# Patient Record
Sex: Female | Born: 1985 | Race: Black or African American | Hispanic: No | Marital: Single | Smoking: Current every day smoker
Health system: Southern US, Community
[De-identification: ages and names within clinical notes are randomized; demographics above are authoritative.]

## PROBLEM LIST (undated history)

## (undated) DIAGNOSIS — D259 Leiomyoma of uterus, unspecified: Secondary | ICD-10-CM

## (undated) DIAGNOSIS — D069 Carcinoma in situ of cervix, unspecified: Secondary | ICD-10-CM

## (undated) HISTORY — DX: Carcinoma in situ of cervix, unspecified: D06.9

## (undated) HISTORY — DX: Leiomyoma of uterus, unspecified: D25.9

---

## 2012-11-09 ENCOUNTER — Encounter (HOSPITAL_COMMUNITY): Payer: Self-pay | Admitting: Nurse Practitioner

## 2012-11-09 ENCOUNTER — Emergency Department (HOSPITAL_COMMUNITY): Payer: Self-pay

## 2012-11-09 ENCOUNTER — Emergency Department (HOSPITAL_COMMUNITY)
Admission: EM | Admit: 2012-11-09 | Discharge: 2012-11-09 | Disposition: A | Discharge status: Home / Self Care | Payer: Self-pay | Attending: Emergency Medicine | Admitting: Emergency Medicine

## 2012-11-09 DIAGNOSIS — R110 Nausea: Secondary | ICD-10-CM | POA: Insufficient documentation

## 2012-11-09 DIAGNOSIS — R51 Headache: Secondary | ICD-10-CM | POA: Insufficient documentation

## 2012-11-09 DIAGNOSIS — R05 Cough: Secondary | ICD-10-CM | POA: Insufficient documentation

## 2012-11-09 DIAGNOSIS — J069 Acute upper respiratory infection, unspecified: Secondary | ICD-10-CM | POA: Insufficient documentation

## 2012-11-09 DIAGNOSIS — J3489 Other specified disorders of nose and nasal sinuses: Secondary | ICD-10-CM | POA: Insufficient documentation

## 2012-11-09 DIAGNOSIS — J40 Bronchitis, not specified as acute or chronic: Secondary | ICD-10-CM | POA: Insufficient documentation

## 2012-11-09 DIAGNOSIS — F17200 Nicotine dependence, unspecified, uncomplicated: Secondary | ICD-10-CM | POA: Insufficient documentation

## 2012-11-09 DIAGNOSIS — J111 Influenza due to unidentified influenza virus with other respiratory manifestations: Secondary | ICD-10-CM | POA: Insufficient documentation

## 2012-11-09 DIAGNOSIS — R059 Cough, unspecified: Secondary | ICD-10-CM | POA: Insufficient documentation

## 2012-11-09 MED ORDER — HYDROCODONE-HOMATROPINE 5-1.5 MG/5ML PO SYRP: 5.0000 mL | ORAL_SOLUTION | Freq: Four times a day (QID) | ORAL | Status: DC | PRN

## 2012-11-09 MED ORDER — OXYMETAZOLINE HCL 0.05 % NA SOLN: 2.0000 | Freq: Two times a day (BID) | NASAL | Status: DC

## 2012-11-09 MED ORDER — ACETAMINOPHEN 325 MG PO TABS
650.0000 mg | ORAL_TABLET | Freq: Once | ORAL | Status: AC
  Administered 2012-11-09: 650 mg via ORAL
  Filled 2012-11-09: qty 2

## 2012-11-09 NOTE — ED Notes (Signed)
C/o fevers, chills, dry cough since Monday. Seen at [facility] hospital Monday and given motrin and hydrocodone cough syrup with no relief. symptoms have persisted and continues to have fevers.

## 2012-11-09 NOTE — ED Notes (Signed)
Patient transported to X-ray 

## 2012-11-09 NOTE — ED Notes (Addendum)
Pt c/o congestion since Monday, reports she went to ED in [place] and they told her she had an URI. Pt reports she thinks she started to get a fever today. And reports she has been taking tylenol at home, but didn't receive any prescriptions from [facility]. Pt in nad. Family at bedside.

## 2012-11-09 NOTE — ED Provider Notes (Signed)
History     CSN: [id number]  Arrival date & time 11/09/12  1321   First MD Initiated Contact with Patient 11/09/12 1337      Chief Complaint  Patient presents with  . URI    (Consider location/radiation/quality/duration/timing/severity/associated sxs/prior treatment) HPI Comments: Patient presents today with a chief complaint of fever, chills, headache, cough, and body aches.  She reports that her symptoms have been present for the past 5 days.  Symptoms unchanged from onset.  She was seen in the ED at [facility] 5 days ago and was diagnosed with an URI.  She had a flu swab done at that time, which she reports was negative.  She states that she also had UA, which was also negative.  She denies any urinary symptoms.  She has been taking a cough suppressant containing Hydrocodone and Motrin for her symptoms, which is helping somewhat.  She did not have the Influenza vaccine this year and has been around sick contacts with the Flu.  She denies chest pain or SOB.  Denies neck pain or stiffness.   The history is provided by the patient.    History reviewed. No pertinent past medical history.  History reviewed. No pertinent past surgical history.  History reviewed. No pertinent family history.  History  Substance Use Topics  . Smoking status: Current Every Day Smoker  . Smokeless tobacco: Not on file  . Alcohol Use: No    OB History    Grav Para Term Preterm Abortions TAB SAB Ect Mult Living                  Review of Systems  Constitutional: Positive for fever and chills.  HENT: Positive for congestion and rhinorrhea. Negative for sore throat, trouble swallowing, neck pain, neck stiffness and sinus pressure.   Respiratory: Positive for cough. Negative for shortness of breath.   Cardiovascular: Negative for chest pain.  Gastrointestinal: Positive for nausea. Negative for vomiting, abdominal pain and diarrhea.  Neurological: Positive for headaches.    Allergies  Sulfa  antibiotics  Home Medications   Current Outpatient Rx  Name  Route  Sig  Dispense  Refill  . HYDROCODONE-HOMATROPINE 5-1.5 MG/5ML PO SYRP   Oral   Take 15 mLs by mouth every 6 (six) hours as needed. For cough           BP 110/74  Pulse 108  Temp 100.4 F (38 C) (Oral)  Resp 18  SpO2 98%  LMP 10/31/2012  Physical Exam  Nursing note and vitals reviewed. Constitutional: She appears well-developed and well-nourished. No distress.  HENT:  Head: Normocephalic and atraumatic.  Right Ear: Tympanic membrane and ear canal normal.  Left Ear: Tympanic membrane and ear canal normal.  Nose: Mucosal edema and rhinorrhea present. Right sinus exhibits no maxillary sinus tenderness and no frontal sinus tenderness. Left sinus exhibits no maxillary sinus tenderness and no frontal sinus tenderness.  Mouth/Throat: Uvula is midline, oropharynx is clear and moist and mucous membranes are normal. No oropharyngeal exudate or posterior oropharyngeal edema.  Neck: Normal range of motion. Neck supple.  Cardiovascular: Normal rate, regular rhythm and normal heart sounds.   Pulmonary/Chest: Effort normal and breath sounds normal. No respiratory distress. She has no decreased breath sounds. She has no wheezes. She has no rales.  Neurological: She is alert.  Skin: Skin is warm and dry. She is not diaphoretic.  Psychiatric: She has a normal mood and affect.    ED Course  Procedures (including critical  care time)  Labs Reviewed - No data to display Dg Chest 2 View  11/09/2012  *RADIOLOGY REPORT*  Clinical Data: Fever.  Cough.  Congestion.  CHEST - 2 VIEW  Comparison: None.  Findings: Airway thickening may reflect bronchitis or reactive airways disease.  No airspace opacity is identified to suggest bacterial pneumonia pattern.  Cardiac and mediastinal contours appear unremarkable.  No pleural effusion identified.  IMPRESSION: 1. Airway thickening may reflect bronchitis or reactive airways disease.  No  airspace opacity is identified to suggest bacterial pneumonia pattern.   Original Report Authenticated By: Gaylyn Rong, M.D.      1. Influenza   2. Bronchitis       MDM  Patient with symptoms consistent with influenza.  Vitals are stable, low-grade fever.  No signs of dehydration, tolerating PO's.  Lungs are clear.Discussed the cost versus benefit of Tamiflu treatment with the patient.  The patient understands that symptoms are greater than the recommended 24-48 hour window of treatment.  Patient will be discharged with instructions to orally hydrate, rest, and use over-the-counter medications such as anti-inflammatories ibuprofen and Aleve for muscle aches and Tylenol for fever.  Patient will also be given a cough suppressant.         Pascal Lux [place], PA-C 11/09/12 1757

## 2012-11-09 NOTE — ED Notes (Signed)
Fever, chills, body aches, cough since Monday. Sx continue.

## 2012-11-10 NOTE — ED Provider Notes (Signed)
Medical screening examination/treatment/procedure(s) were performed by non-physician practitioner and as supervising physician I was immediately available for consultation/collaboration.  Daden Mahany, MD 11/10/12 0851 

## 2015-02-17 ENCOUNTER — Other Ambulatory Visit (HOSPITAL_COMMUNITY): Payer: Self-pay | Admitting: Obstetrics and Gynecology

## 2015-02-17 DIAGNOSIS — E049 Nontoxic goiter, unspecified: Secondary | ICD-10-CM

## 2015-02-19 ENCOUNTER — Ambulatory Visit (HOSPITAL_COMMUNITY)
Admission: RE | Admit: 2015-02-19 | Discharge: 2015-02-19 | Disposition: A | Discharge status: Home / Self Care | Payer: BLUE CROSS/BLUE SHIELD | Source: Ambulatory Visit | Attending: Obstetrics and Gynecology | Admitting: Obstetrics and Gynecology

## 2015-02-19 DIAGNOSIS — E042 Nontoxic multinodular goiter: Secondary | ICD-10-CM | POA: Diagnosis not present

## 2015-02-19 DIAGNOSIS — E049 Nontoxic goiter, unspecified: Secondary | ICD-10-CM | POA: Diagnosis present

## 2015-03-04 HISTORY — PX: CERVICAL BIOPSY  W/ LOOP ELECTRODE EXCISION: SUR135

## 2015-11-18 ENCOUNTER — Encounter: Payer: Self-pay | Admitting: Gynecology

## 2015-11-18 ENCOUNTER — Ambulatory Visit (INDEPENDENT_AMBULATORY_CARE_PROVIDER_SITE_OTHER): Payer: BLUE CROSS/BLUE SHIELD | Admitting: Gynecology

## 2015-11-18 VITALS — BP 128/80 | Ht 62.0 in | Wt 170.0 lb

## 2015-11-18 DIAGNOSIS — Z30432 Encounter for removal of intrauterine contraceptive device: Secondary | ICD-10-CM | POA: Diagnosis not present

## 2015-11-18 DIAGNOSIS — Z309 Encounter for contraceptive management, unspecified: Secondary | ICD-10-CM

## 2015-11-18 DIAGNOSIS — Z30011 Encounter for initial prescription of contraceptive pills: Secondary | ICD-10-CM

## 2015-11-18 DIAGNOSIS — Z8742 Personal history of other diseases of the female genital tract: Secondary | ICD-10-CM | POA: Diagnosis not present

## 2015-11-18 DIAGNOSIS — Z720 Tobacco use: Secondary | ICD-10-CM | POA: Diagnosis not present

## 2015-11-18 DIAGNOSIS — F17200 Nicotine dependence, unspecified, uncomplicated: Secondary | ICD-10-CM

## 2015-11-18 MED ORDER — LEVONORGESTREL-ETHINYL ESTRAD 0.1-20 MG-MCG PO TABS: 1.0000 | ORAL_TABLET | Freq: Every day | ORAL | Status: DC

## 2015-11-18 NOTE — Patient Instructions (Signed)
Steps to Quit Smoking  Smoking tobacco can be harmful to your health and can affect almost every organ in your body. Smoking puts you, and those around you, at risk for developing many serious chronic diseases. Quitting smoking is difficult, but it is one of the best things that you can do for your health. It is never too late to quit. WHAT ARE THE BENEFITS OF QUITTING SMOKING? When you quit smoking, you lower your risk of developing serious diseases and conditions, such as:  Lung cancer or lung disease, such as COPD.  Heart disease.  Stroke.  Heart attack.  Infertility.  Osteoporosis and bone fractures. Additionally, symptoms such as coughing, wheezing, and shortness of breath may get better when you quit. You may also find that you get sick less often because your body is stronger at fighting off colds and infections. If you are pregnant, quitting smoking can help to reduce your chances of having a baby of low birth weight. HOW DO I GET READY TO QUIT? When you decide to quit smoking, create a plan to make sure that you are successful. Before you quit:  Pick a date to quit. Set a date within the next two weeks to give you time to prepare.  Write down the reasons why you are quitting. Keep this list in places where you will see it often, such as on your bathroom mirror or in your car or wallet.  Identify the people, places, things, and activities that make you want to smoke (triggers) and avoid them. Make sure to take these actions:  Throw away all cigarettes at home, at work, and in your car.  Throw away smoking accessories, such as ashtrays and lighters.  Clean your car and make sure to empty the ashtray.  Clean your home, including curtains and carpets.  Tell your family, friends, and coworkers that you are quitting. Support from your loved ones can make quitting easier.  Talk with your health care provider about your options for quitting smoking.  Find out what treatment  options are covered by your health insurance. WHAT STRATEGIES CAN I USE TO QUIT SMOKING?  Talk with your healthcare provider about different strategies to quit smoking. Some strategies include:  Quitting smoking altogether instead of gradually lessening how much you smoke over a period of time. Research shows that quitting "cold turkey" is more successful than gradually quitting.  Attending in-person counseling to help you build problem-solving skills. You are more likely to have success in quitting if you attend several counseling sessions. Even short sessions of 10 minutes can be effective.  Finding resources and support systems that can help you to quit smoking and remain smoke-free after you quit. These resources are most helpful when you use them often. They can include:  Online chats with a counselor.  Telephone quitlines.  Printed self-help materials.  Support groups or group counseling.  Text messaging programs.  Mobile phone applications.  Taking medicines to help you quit smoking. (If you are pregnant or breastfeeding, talk with your health care provider first.) Some medicines contain nicotine and some do not. Both types of medicines help with cravings, but the medicines that include nicotine help to relieve withdrawal symptoms. Your health care provider may recommend:  Nicotine patches, gum, or lozenges.  Nicotine inhalers or sprays.  Non-nicotine medicine that is taken by mouth. Talk with your health care provider about combining strategies, such as taking medicines while you are also receiving in-person counseling. Using these two strategies together makes   you more likely to succeed in quitting than if you used either strategy on its own. If you are pregnant or breastfeeding, talk with your health care provider about finding counseling or other support strategies to quit smoking. Do not take medicine to help you quit smoking unless told to do so by your health care  provider. WHAT THINGS CAN I DO TO MAKE IT EASIER TO QUIT? Quitting smoking might feel overwhelming at first, but there is a lot that you can do to make it easier. Take these important actions:  Reach out to your family and friends and ask that they support and encourage you during this time. Call telephone quitlines, reach out to support groups, or work with a counselor for support.  Ask people who smoke to avoid smoking around you.  Avoid places that trigger you to smoke, such as bars, parties, or smoke-break areas at work.  Spend time around people who do not smoke.  Lessen stress in your life, because stress can be a smoking trigger for some people. To lessen stress, try:  Exercising regularly.  Deep-breathing exercises.  Yoga.  Meditating.  Performing a body scan. This involves closing your eyes, scanning your body from head to toe, and noticing which parts of your body are particularly tense. Purposefully relax the muscles in those areas.  Download or purchase mobile phone or tablet apps (applications) that can help you stick to your quit plan by providing reminders, tips, and encouragement. There are many free apps, such as QuitGuide from the [employer] Office manager for Disease Control and Prevention). You can find other support for quitting smoking (smoking cessation) through smokefree.gov and other websites. HOW WILL I FEEL WHEN I QUIT SMOKING? Within the first 24 hours of quitting smoking, you may start to feel some withdrawal symptoms. These symptoms are usually most noticeable 2-3 days after quitting, but they usually do not last beyond 2-3 weeks. Changes or symptoms that you might experience include:  Mood swings.  Restlessness, anxiety, or irritation.  Difficulty concentrating.  Dizziness.  Strong cravings for sugary foods in addition to nicotine.  Mild weight gain.  Constipation.  Nausea.  Coughing or a sore throat.  Changes in how your medicines work in your  body.  A depressed mood.  Difficulty sleeping (insomnia). After the first 2-3 weeks of quitting, you may start to notice more positive results, such as:  Improved sense of smell and taste.  Decreased coughing and sore throat.  Slower heart rate.  Lower blood pressure.  Clearer skin.  The ability to breathe more easily.  Fewer sick days. Quitting smoking is very challenging for most people. Do not get discouraged if you are not successful the first time. Some people need to make many attempts to quit before they achieve long-term success. Do your best to stick to your quit plan, and talk with your health care provider if you have any questions or concerns.   This information is not intended to replace advice given to you by your health care provider. Make sure you discuss any questions you have with your health care provider.   Document Released: 09/26/2001 Document Revised: 02/16/2015 Document Reviewed: 02/16/2015 Elsevier Interactive Patient Education 2016 [employer]. Oral Contraception Information Oral contraceptive pills (OCPs) are medicines taken to prevent pregnancy. OCPs work by preventing the ovaries from releasing eggs. The hormones in OCPs also cause the cervical mucus to thicken, preventing the sperm from entering the uterus. The hormones also cause the uterine lining to become thin, not  allowing a fertilized egg to attach to the inside of the uterus. OCPs are highly effective when taken exactly as prescribed. However, OCPs do not prevent sexually transmitted diseases (STDs). Safe sex practices, such as using condoms along with the pill, can help prevent STDs.  Before taking the pill, you may have a physical exam and Pap test. Your health care provider may order blood tests. The health care provider will make sure you are a good candidate for oral contraception. Discuss with your health care provider the possible side effects of the OCP you may be prescribed. When starting an  OCP, it can take 2 to 3 months for the body to adjust to the changes in hormone levels in your body.  TYPES OF ORAL CONTRACEPTION  The combination pill--This pill contains estrogen and progestin (synthetic progesterone) hormones. The combination pill comes in 21-day, 28-day, or 91-day packs. Some types of combination pills are meant to be taken continuously (365-day pills). With 21-day packs, you do not take pills for 7 days after the last pill. With 28-day packs, the pill is taken every day. The last 7 pills are without hormones. Certain types of pills have more than 21 hormone-containing pills. With 91-day packs, the first 84 pills contain both hormones, and the last 7 pills contain no hormones or contain estrogen only.  The minipill--This pill contains the progesterone hormone only. The pill is taken every day continuously. It is very important to take the pill at the same time each day. The minipill comes in packs of 28 pills. All 28 pills contain the hormone.  ADVANTAGES OF ORAL CONTRACEPTIVE PILLS  Decreases premenstrual symptoms.   Treats menstrual period cramps.   Regulates the menstrual cycle.   Decreases a heavy menstrual flow.   May treatacne, depending on the type of pill.   Treats abnormal uterine bleeding.   Treats polycystic ovarian syndrome.   Treats endometriosis.   Can be used as emergency contraception.  THINGS THAT CAN MAKE ORAL CONTRACEPTIVE PILLS LESS EFFECTIVE OCPs can be less effective if:   You forget to take the pill at the same time every day.   You have a stomach or intestinal disease that lessens the absorption of the pill.   You take OCPs with other medicines that make OCPs less effective, such as antibiotics, certain HIV medicines, and some seizure medicines.   You take expired OCPs.   You forget to restart the pill on day 7, when using the packs of 21 pills.  RISKS ASSOCIATED WITH ORAL CONTRACEPTIVE PILLS  Oral contraceptive pills  can sometimes cause side effects, such as:  Headache.  Nausea.  Breast tenderness.  Irregular bleeding or spotting. Combination pills are also associated with a small increased risk of:  Blood clots.  Heart attack.  Stroke.   This information is not intended to replace advice given to you by your health care provider. Make sure you discuss any questions you have with your health care provider.   Document Released: 12/23/2002 Document Revised: 07/23/2013 Document Reviewed: 03/23/2013 Elsevier Interactive Patient Education [employer].

## 2015-11-18 NOTE — Progress Notes (Signed)
   Patient is a 30 year old gravida 0 new patient to the practice. Patient was here requesting to have the Mirena IUD removed that was placed in August 2016 by another provider in the community. Patient states that she does not like the uncertainty of her menses and sometimes spotting. Patient also had informed me that in the past she's had history of right ovarian cyst. Patient smokes approximately half a pack cigarette per day. Patient stated that last year she had a Pap smear which was normal. She is due for her next annual exam this summer. Patient was wishing to go on the oral contraceptive pill. She denied any family history of any clotting disorders or herself.  Exam: Abdomen: Soft nontender no rebound or guarding Pelvic: Bartholin urethra Skene was within normal limits Vagina: No lesions or discharge Cervix: IUD string visualized Bimanual exam: Uterus upper limits of normal nontender no palpable masses Adnexa: No palpable masses or tenderness Rectal exam: Not done  The cervix was cleansed with Betadine solution. A [place] clamp was utilized to grasp the IUD string. The IUD was retrieved shown to the patient discarded.  Assessment/plan: Patient requesting removal of the Mirena IUD which is in place last year was accomplished without any difficulty. Patient requesting going on the oral contraceptive pill. She is going to be placed on a low dose 20 g oral contraceptive pill. I stressed upon her the importance of discontinuing smoking as well as literature information provided. She is fully aware that she would be at increased risk for the venous thrombosis and pulmonary embolism. She fully understands and accepts and is working on contact placed discontinuation of her smoking habit. Patient will return back in June of this year for annual exam.

## 2015-12-06 ENCOUNTER — Encounter: Payer: Self-pay | Admitting: Gynecology

## 2015-12-07 IMAGING — US US SOFT TISSUE HEAD/NECK
1 series · 14 of 25 positions shown · non-contrast
Comparison: None.

CLINICAL DATA: Enlarged thyroid.

EXAM:
THYROID ULTRASOUND
TECHNIQUE: Ultrasound examination of the thyroid gland and adjacent soft
tissues was performed.

[Series 1: us soft tissue head/neck · 0.05mm/px · 14 of 57 slices shown]
[im 1/57]
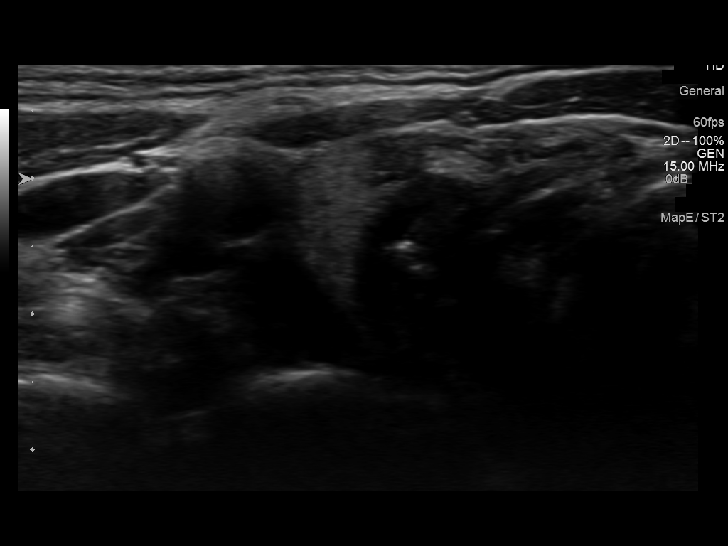
[im 5/57]
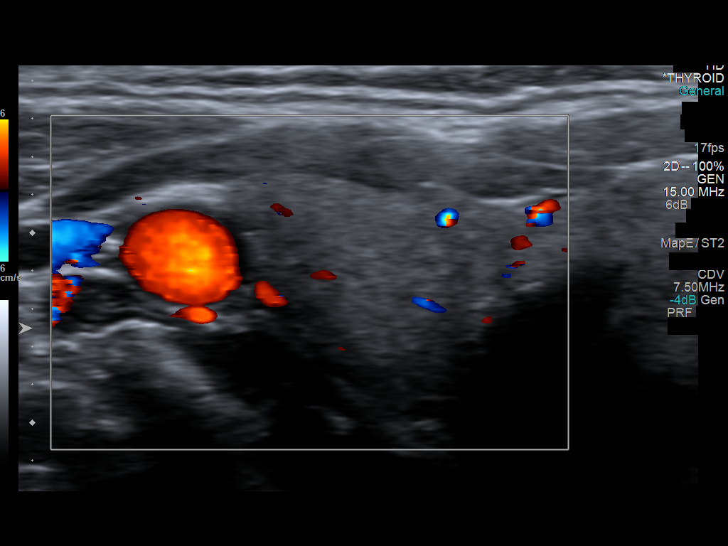
[im 10/57]
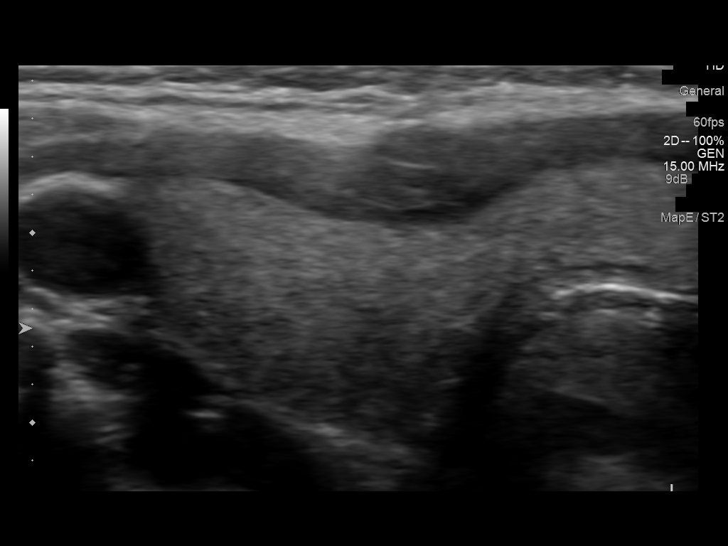
[im 15/57]
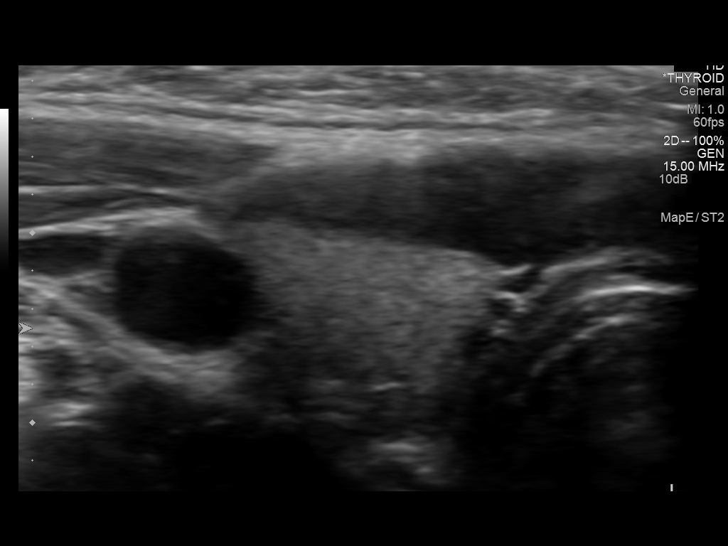
[im 19/57]
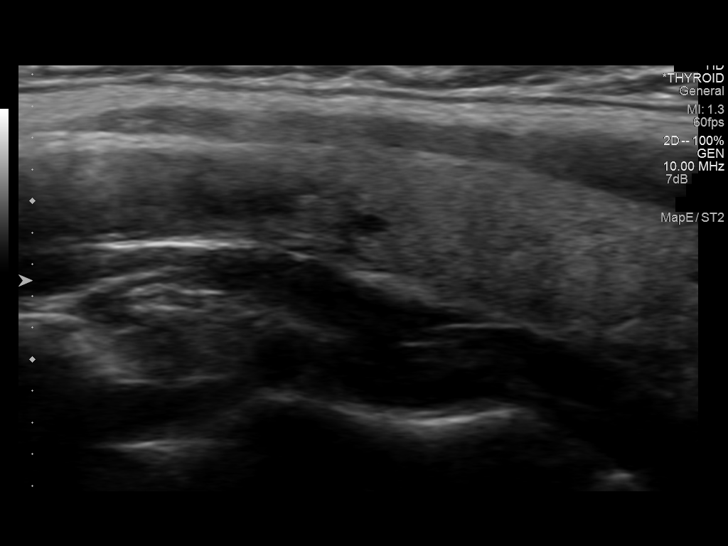
[im 22/57]
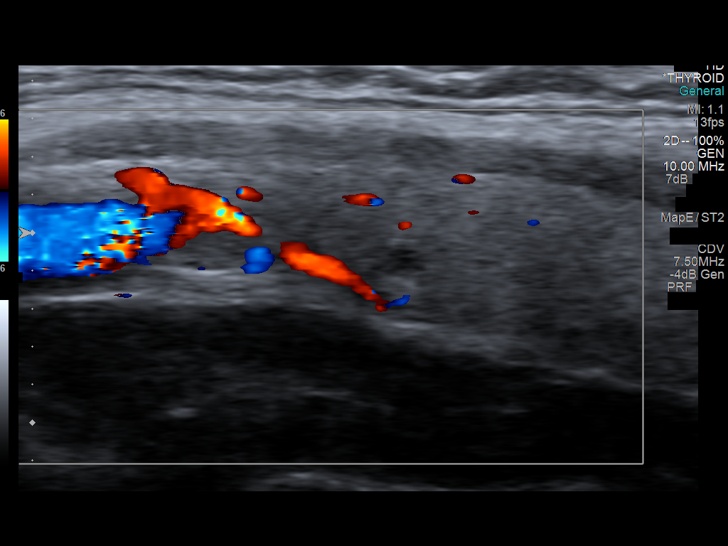
[im 26/57]
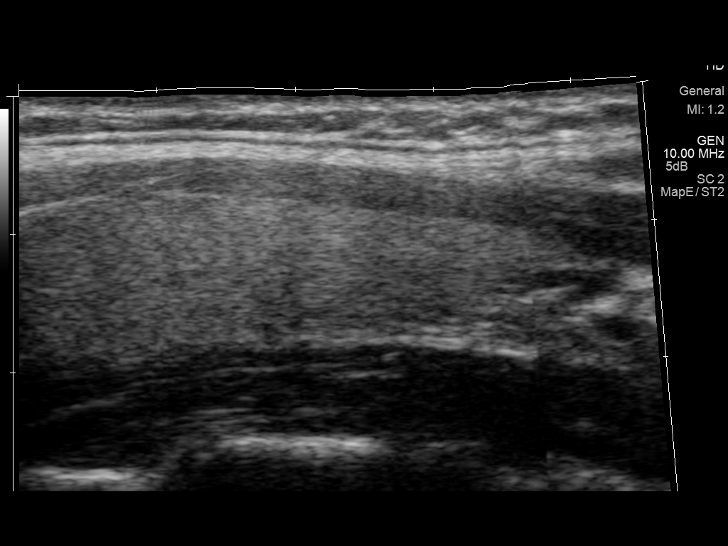
[im 31/57]
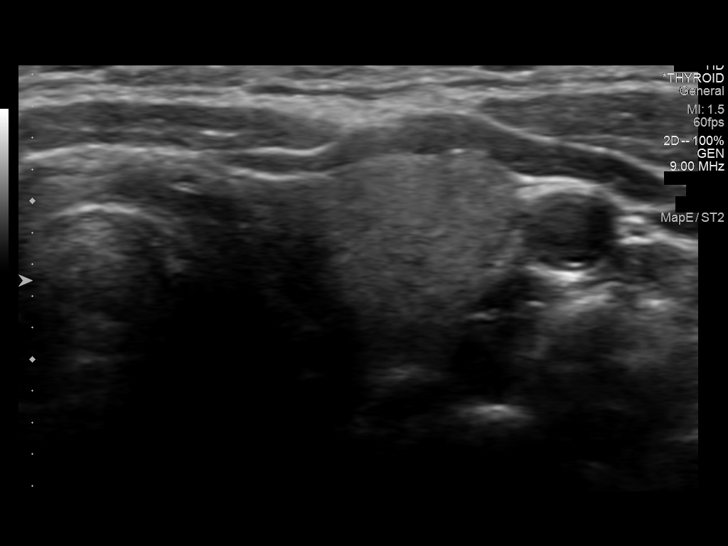
[im 36/57]
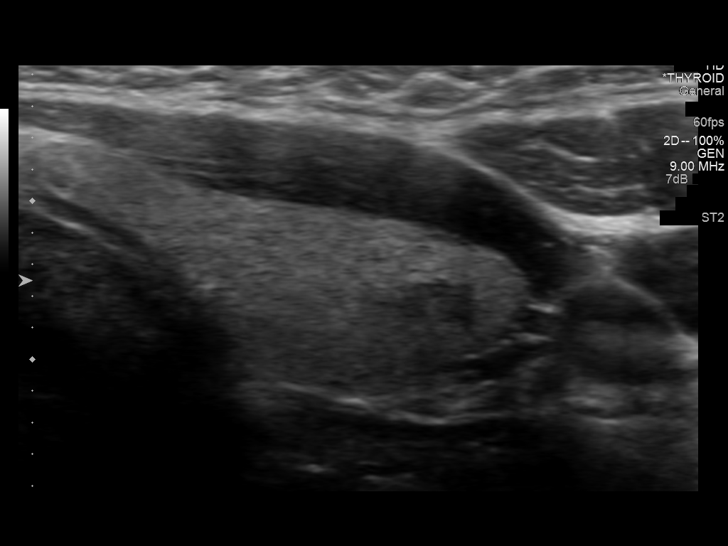
[im 38/57]
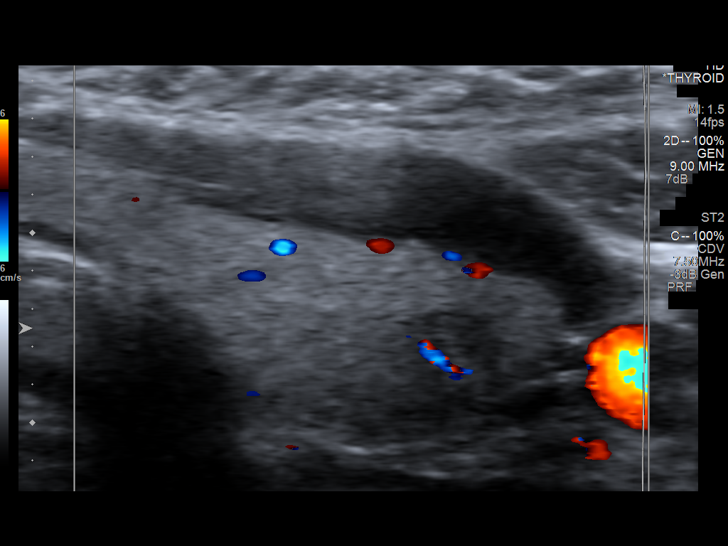
[im 43/57]
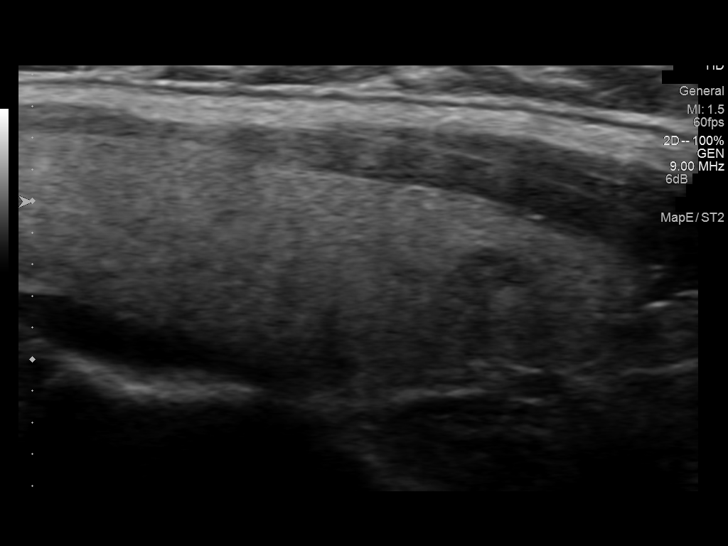
[im 47/57]
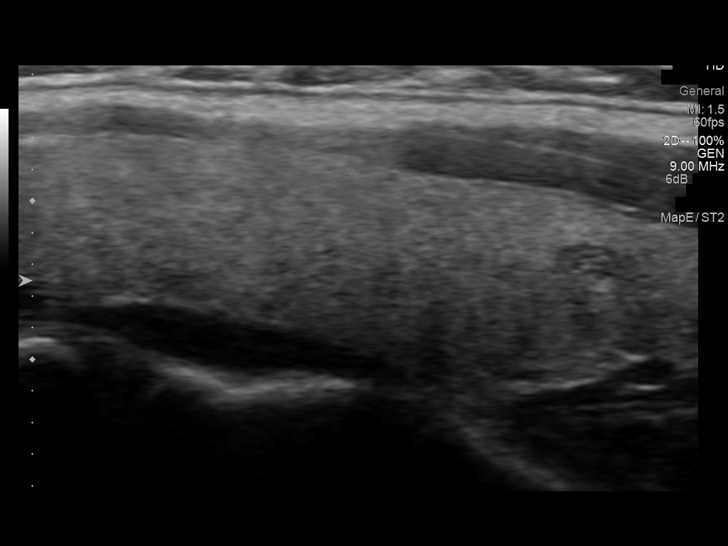
[im 52/57]
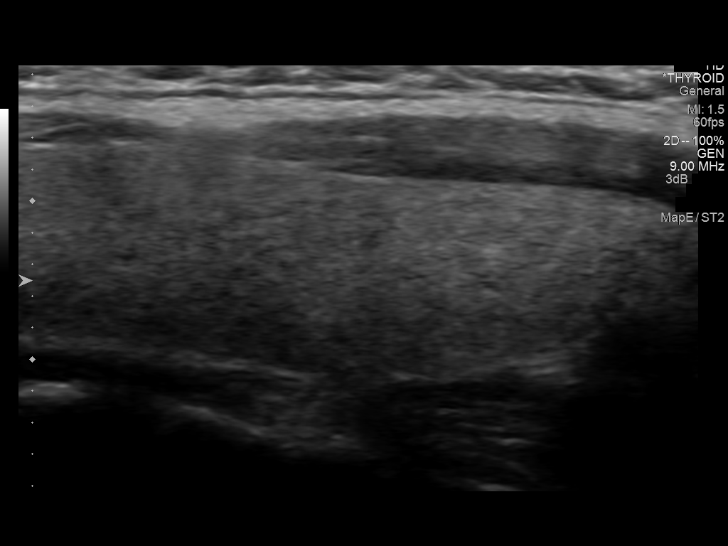
[im 57/57]
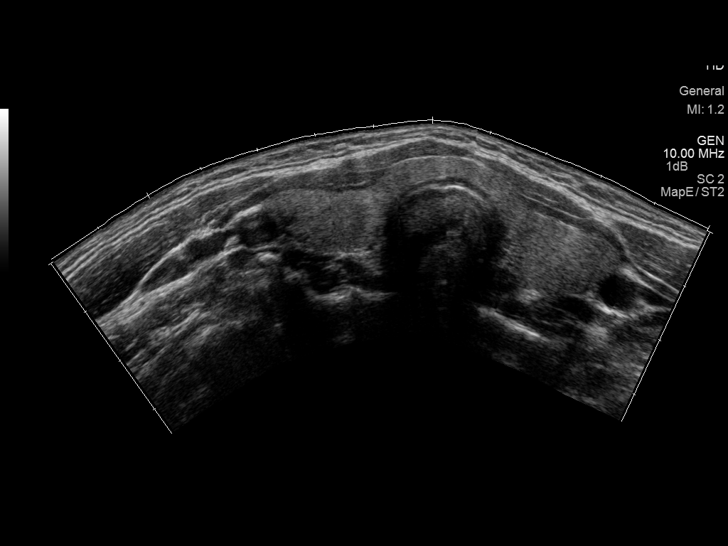

[14 of 25 positions shown; findings below may reference images not displayed]

FINDINGS: Right thyroid lobe

Measurements: 4.4 x 1.2 x 1.8 cm. There is a punctate hypoechoic
structure in the right thyroid lobe measuring up to 0.2 cm.
Otherwise, the thyroid tissue is homogeneous.

Left thyroid lobe

Measurements: 4.8 x 1.3 x 1.7 cm. Poorly defined hypoechoic nodule
in the left thyroid lobe measures up to 0.5 cm.

Isthmus

Thickness: 0.4 cm.  No nodules visualized.

Lymphadenopathy

None visualized.
IMPRESSION: Small thyroid nodules. Largest measures 0.5 cm. These nodules do not
meet criteria for biopsy. This recommendation follows the consensus
statement: Management of Thyroid Nodules Detected at US: Society of
Radiologists in Ultrasound Consensus Conference Statement. Radiology

## 2016-03-28 ENCOUNTER — Ambulatory Visit (INDEPENDENT_AMBULATORY_CARE_PROVIDER_SITE_OTHER): Payer: BLUE CROSS/BLUE SHIELD | Admitting: Gynecology

## 2016-03-28 ENCOUNTER — Encounter: Payer: Self-pay | Admitting: Gynecology

## 2016-03-28 VITALS — BP 128/84 | Ht 62.0 in | Wt 175.8 lb

## 2016-03-28 DIAGNOSIS — Z8619 Personal history of other infectious and parasitic diseases: Secondary | ICD-10-CM

## 2016-03-28 DIAGNOSIS — D259 Leiomyoma of uterus, unspecified: Secondary | ICD-10-CM

## 2016-03-28 DIAGNOSIS — Z87410 Personal history of cervical dysplasia: Secondary | ICD-10-CM | POA: Insufficient documentation

## 2016-03-28 DIAGNOSIS — Z01419 Encounter for gynecological examination (general) (routine) without abnormal findings: Secondary | ICD-10-CM

## 2016-03-28 NOTE — Progress Notes (Signed)
Jane Cannon 26-May-1986 [id number]   History:    30 y.o.  for annual gyn exam who was seen in the office for the first time in February this year where she was requesting removal of her Mirena IUD. She is contemplating on getting pregnant. She does smoke approximately half a pack cigarette per day. Review of her record also indicated that with another provider several years ago she had a LEEP cervical conization for CIN-3 reports normal Pap smears afterwards. She also has a history of a fibroid uterus. Her PCP has been doing her blood work at [facility]. She is currently on her menstrual cycle which started today. Patient states that many years ago she had Chlamydia infection.  Past medical history,surgical history, family history and social history were all reviewed and documented in the EPIC chart.  Gynecologic History Patient's last menstrual period was 03/28/2016. Contraception: none Last Pap: Several years ago. Results were: See above Last mammogram: Not indicated. Results were: Not indicated  Obstetric History OB History  Gravida Para Term Preterm AB SAB TAB Ectopic Multiple Living  0 0 0 0 0 0 0 0 0 0         ROS: A ROS was performed and pertinent positives and negatives are included in the history.  GENERAL: No fevers or chills. HEENT: No change in vision, no earache, sore throat or sinus congestion. NECK: No pain or stiffness. CARDIOVASCULAR: No chest pain or pressure. No palpitations. PULMONARY: No shortness of breath, cough or wheeze. GASTROINTESTINAL: No abdominal pain, nausea, vomiting or diarrhea, melena or bright red blood per rectum. GENITOURINARY: No urinary frequency, urgency, hesitancy or dysuria. MUSCULOSKELETAL: No joint or muscle pain, no back pain, no recent trauma. DERMATOLOGIC: No rash, no itching, no lesions. ENDOCRINE: No polyuria, polydipsia, no heat or cold intolerance. No recent change in weight. HEMATOLOGICAL: No anemia or easy bruising or  bleeding. NEUROLOGIC: No headache, seizures, numbness, tingling or weakness. PSYCHIATRIC: No depression, no loss of interest in normal activity or change in sleep pattern.     Exam: chaperone present  BP 128/84 mmHg  Ht 5' 2" (1.575 m)  Wt 175 lb 12.8 oz (79.742 kg)  BMI 32.15 kg/m2  LMP 03/28/2016  Body mass index is 32.15 kg/(m^2).  General appearance : Well developed well nourished female. No acute distress HEENT: Eyes: no retinal hemorrhage or exudates,  Neck supple, trachea midline, no carotid bruits, no thyroidmegaly Lungs: Clear to auscultation, no rhonchi or wheezes, or rib retractions  Heart: Regular rate and rhythm, no murmurs or gallops Breast:Examined in sitting and supine position were symmetrical in appearance, no palpable masses or tenderness,  no skin retraction, no nipple inversion, no nipple discharge, no skin discoloration, no axillary or supraclavicular lymphadenopathy Abdomen: no palpable masses or tenderness, no rebound or guarding Extremities: no edema or skin discoloration or tenderness  Pelvic:  Bartholin, Urethra, Skene Glands: Within normal limits             Vagina: Menstrual blood present  Cervix: No gross lesions or discharge  Uterus  6-8 weeks size irregular  Adnexa  Without masses or tenderness  Anus and perineum  normal   Rectovaginal  normal sphincter tone without palpated masses or tenderness             Hemoccult not indicated     Assessment/Plan:  30 y.o. female for annual exam with fibroid uterus contemplating on getting pregnant will schedule an ultrasound here in the office in the next 1-2  weeks. Her PCP is been doing her blood work. We discussed importance of prenatal vitamins for neural tube defect prophylaxis to take 1 tablet daily. We also discussed the detrimental effects of smoking and literature information was provided. She did have a chest x-ray in 2014. 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 Patient was provided with instructions on the utilization of ovulation predictor kit to time or intercourse.   FERNANDEZ,JUAN H MD, 5:12 PM 03/28/2016    

## 2016-03-28 NOTE — Patient Instructions (Addendum)
Smoking Cessation, Tips for Success If you are ready to quit smoking, congratulations! You have chosen to help yourself be healthier. Cigarettes bring nicotine, tar, carbon monoxide, and other irritants into your body. Your lungs, heart, and blood vessels will be able to work better without these poisons. There are many different ways to quit smoking. Nicotine gum, nicotine patches, a nicotine inhaler, or nicotine nasal spray can help with physical craving. Hypnosis, support groups, and medicines help break the habit of smoking. WHAT THINGS CAN I DO TO MAKE QUITTING EASIER?  Here are some tips to help you quit for good:  Pick a date when you will quit smoking completely. Tell all of your friends and family about your plan to quit on that date.  Do not try to slowly cut down on the number of cigarettes you are smoking. Pick a quit date and quit smoking completely starting on that day.  Throw away all cigarettes.   Clean and remove all ashtrays from your home, work, and car.  On a card, write down your reasons for quitting. Carry the card with you and read it when you get the urge to smoke.  Cleanse your body of nicotine. Drink enough water and fluids to keep your urine clear or pale yellow. Do this after quitting to flush the nicotine from your body.  Learn to predict your moods. Do not let a bad situation be your excuse to have a cigarette. Some situations in your life might tempt you into wanting a cigarette.  Never have "just one" cigarette. It leads to wanting another and another. Remind yourself of your decision to quit.  Change habits associated with smoking. If you smoked while driving or when feeling stressed, try other activities to replace smoking. Stand up when drinking your coffee. Brush your teeth after eating. Sit in a different chair when you read the paper. Avoid alcohol while trying to quit, and try to drink fewer caffeinated beverages. Alcohol and caffeine may urge you to  smoke.  Avoid foods and drinks that can trigger a desire to smoke, such as sugary or spicy foods and alcohol.  Ask people who smoke not to smoke around you.  Have something planned to do right after eating or having a cup of coffee. For example, plan to take a walk or exercise.  Try a relaxation exercise to calm you down and decrease your stress. Remember, you may be tense and nervous for the first 2 weeks after you quit, but this will pass.  Find new activities to keep your hands busy. Play with a pen, coin, or rubber band. Doodle or draw things on paper.  Brush your teeth right after eating. This will help cut down on the craving for the taste of tobacco after meals. You can also try mouthwash.   Use oral substitutes in place of cigarettes. Try using lemon drops, carrots, cinnamon sticks, or chewing gum. Keep them handy so they are available when you have the urge to smoke.  When you have the urge to smoke, try deep breathing.  Designate your home as a nonsmoking area.  If you are a heavy smoker, ask your health care provider about a prescription for nicotine chewing gum. It can ease your withdrawal from nicotine.  Reward yourself. Set aside the cigarette money you save and buy yourself something nice.  Look for support from others. Join a support group or smoking cessation program. Ask someone at home or at work to help you with your plan   to quit smoking.  Always ask yourself, "Do I need this cigarette or is this just a reflex?" Tell yourself, "Today, I choose not to smoke," or "I do not want to smoke." You are reminding yourself of your decision to quit.  Do not replace cigarette smoking with electronic cigarettes (commonly called e-cigarettes). The safety of e-cigarettes is unknown, and some may contain harmful chemicals.  If you relapse, do not give up! Plan ahead and think about what you will do the next time you get the urge to smoke. HOW WILL I FEEL WHEN I QUIT SMOKING? You  may have symptoms of withdrawal because your body is used to nicotine (the addictive substance in cigarettes). You may crave cigarettes, be irritable, feel very hungry, cough often, get headaches, or have difficulty concentrating. The withdrawal symptoms are only temporary. They are strongest when you first quit but will go away within 10-14 days. When withdrawal symptoms occur, stay in control. Think about your reasons for quitting. Remind yourself that these are signs that your body is healing and getting used to being without cigarettes. Remember that withdrawal symptoms are easier to treat than the major diseases that smoking can cause.  Even after the withdrawal is over, expect periodic urges to smoke. However, these cravings are generally short lived and will go away whether you smoke or not. Do not smoke! WHAT RESOURCES ARE AVAILABLE TO HELP ME QUIT SMOKING? Your health care provider can direct you to community resources or hospitals for support, which may include:  Group support.  Education.  Hypnosis.  Therapy.   This information is not intended to replace advice given to you by your health care provider. Make sure you discuss any questions you have with your health care provider.   Document Released: 06/30/2004 Document Revised: 10/23/2014 Document Reviewed: 03/20/2013 Elsevier Interactive Patient Education 2016 [employer]  Uterine Fibroids Uterine fibroids are tissue masses (tumors) that can develop in the womb (uterus). They are also called leiomyomas. This type of tumor is not cancerous (benign) and does not spread to other parts of the body outside of the pelvic area, which is between the hip bones. Occasionally, fibroids may develop in the fallopian tubes, in the cervix, or on the support structures (ligaments) that surround the uterus. You can have one or many fibroids. Fibroids can vary in size, weight, and where they grow in the uterus. Some can become quite large. Most  fibroids do not require medical treatment. CAUSES A fibroid can develop when a single uterine cell keeps growing (replicating). Most cells in the human body have a control mechanism that keeps them from replicating without control. SIGNS AND SYMPTOMS Symptoms may include:   Heavy bleeding during your period.  Bleeding or spotting between periods.  Pelvic pain and pressure.  Bladder problems, such as needing to urinate more often (urinary frequency) or urgently.  Inability to reproduce offspring (infertility).  Miscarriages. DIAGNOSIS Uterine fibroids are diagnosed through a physical exam. Your health care provider may feel the lumpy tumors during a pelvic exam. Ultrasonography and an MRI may be done to determine the size, location, and number of fibroids. TREATMENT Treatment may include:  Watchful waiting. This involves getting the fibroid checked by your health care provider to see if it grows or shrinks. Follow your health care provider's recommendations for how often to have this checked.  Hormone medicines. These can be taken by mouth or given through an intrauterine device (IUD).  Surgery.  Removing the fibroids (myomectomy) or the uterus (  hysterectomy).  Removing blood supply to the fibroids (uterine artery embolization). If fibroids interfere with your fertility and you want to become pregnant, your health care provider may recommend having the fibroids removed.  HOME CARE INSTRUCTIONS  Keep all follow-up visits as directed by your health care provider. This is important.  Take medicines only as directed by your health care provider.  If you were prescribed a hormone treatment, take the hormone medicines exactly as directed.  Do not take aspirin, because it can cause bleeding.  Ask your health care provider about taking iron pills and increasing the amount of dark green, leafy vegetables in your diet. These actions can help to boost your blood iron levels, which may  be affected by heavy menstrual bleeding.  Pay close attention to your period and tell your health care provider about any changes, such as:  Increased blood flow that requires you to use more pads or tampons than usual per month.  A change in the number of days that your period lasts per month.  A change in symptoms that are associated with your period, such as abdominal cramping or back pain. SEEK MEDICAL CARE IF:  You have pelvic pain, back pain, or abdominal cramps that cannot be controlled with medicines.  You have an increase in bleeding between and during periods.  You soak tampons or pads in a half hour or less.  You feel lightheaded, extra tired, or weak. SEEK IMMEDIATE MEDICAL CARE IF:  You faint.  You have a sudden increase in pelvic pain.   This information is not intended to replace advice given to you by your health care provider. Make sure you discuss any questions you have with your health care provider.   Document Released: 09/29/2000 Document Revised: 10/23/2014 Document Reviewed: 03/31/2014 Elsevier Interactive Patient Education [employer].

## 2016-03-30 LAB — PAP, TP IMAGING W/ HPV RNA, RFLX HPV TYPE 16,18/45: HPV mRNA, High Risk: NOT DETECTED

## 2016-04-05 ENCOUNTER — Other Ambulatory Visit: Payer: Self-pay | Admitting: Gynecology

## 2016-04-05 ENCOUNTER — Encounter: Payer: Self-pay | Admitting: Gynecology

## 2016-04-05 ENCOUNTER — Ambulatory Visit (INDEPENDENT_AMBULATORY_CARE_PROVIDER_SITE_OTHER): Payer: BLUE CROSS/BLUE SHIELD | Admitting: Gynecology

## 2016-04-05 ENCOUNTER — Ambulatory Visit (INDEPENDENT_AMBULATORY_CARE_PROVIDER_SITE_OTHER): Payer: BLUE CROSS/BLUE SHIELD

## 2016-04-05 VITALS — BP 126/78

## 2016-04-05 DIAGNOSIS — N852 Hypertrophy of uterus: Secondary | ICD-10-CM

## 2016-04-05 DIAGNOSIS — D251 Intramural leiomyoma of uterus: Secondary | ICD-10-CM

## 2016-04-05 DIAGNOSIS — D259 Leiomyoma of uterus, unspecified: Secondary | ICD-10-CM

## 2016-04-05 DIAGNOSIS — N83202 Unspecified ovarian cyst, left side: Secondary | ICD-10-CM

## 2016-04-05 DIAGNOSIS — N838 Other noninflammatory disorders of ovary, fallopian tube and broad ligament: Secondary | ICD-10-CM

## 2016-04-05 DIAGNOSIS — N839 Noninflammatory disorder of ovary, fallopian tube and broad ligament, unspecified: Secondary | ICD-10-CM | POA: Diagnosis not present

## 2016-04-05 DIAGNOSIS — N83201 Unspecified ovarian cyst, right side: Secondary | ICD-10-CM | POA: Insufficient documentation

## 2016-04-05 MED ORDER — MEDROXYPROGESTERONE ACETATE 150 MG/ML IM SUSP
150.0000 mg | Freq: Once | INTRAMUSCULAR | Status: AC
Start: 2016-04-05 — End: 2016-04-05
  Administered 2016-04-05: 150 mg via INTRAMUSCULAR

## 2016-04-05 NOTE — Progress Notes (Signed)
   HPI: Patient is a 30 year old who was seen in the office for the first time in February this year where she had requested removal of her Mirena IUD her plans for her to try to get pregnant. Then she was seen the office in June of this year for her annual exam. And review of her record indicated that with another provider several years ago she had a LEEP cervical conization for CIN-3 reports normal Pap smears afterwards. She also has a history of a fibroid uterus. Her PCP has been doing her blood work at [facility]. Her Pap smear during that visit was normal. She is here today to discuss results of her ultrasound because during her exam her uterus was felt to be enlarged with a past history of fibroid uterus.   ROS: A ROS was performed and pertinent positives and negatives are included in the history.  GENERAL: No fevers or chills. HEENT: No change in vision, no earache, sore throat or sinus congestion. NECK: No pain or stiffness. CARDIOVASCULAR: No chest pain or pressure. No palpitations. PULMONARY: No shortness of breath, cough or wheeze. GASTROINTESTINAL: No abdominal pain, nausea, vomiting or diarrhea, melena or bright red blood per rectum. GENITOURINARY: No urinary frequency, urgency, hesitancy or dysuria. MUSCULOSKELETAL: No joint or muscle pain, no back pain, no recent trauma. DERMATOLOGIC: No rash, no itching, no lesions. ENDOCRINE: No polyuria, polydipsia, no heat or cold intolerance. No recent change in weight. HEMATOLOGICAL: No anemia or easy bruising or bleeding. NEUROLOGIC: No headache, seizures, numbness, tingling or weakness. PSYCHIATRIC: No depression, no loss of interest in normal activity or change in sleep pattern.   PE: Blood pressure 126/78 Gen. appearance well-developed well-nourished female in no acute distress The rest of the exam was done 2 weeks ago.  Ultrasound: Uterus measured 9.8 59.6 x 7.3 cm with endometrial stripe is 7.1 mm. Patient was several intramural  and subserosal fibroids as follows: Intramural: 7.7 x 5.1 x 6.4 cm, 4.4 x 3.3 x 3.0 cm, 2.3 x 1.4 cm, left subserosal posterior fibroid 3.4 x 3.2 x 3.5 cm with negative color flow. Right ovarian follicle cyst measuring 19 x 15 mm. Left ovarian solid mass measuring 27 x 20 x 20 mm and 19 x 13x 18 mm solid with several small cyst tiny cyst with color flow documented both. Arterial blood flow was noted also left ovary was negative otherwise no fluid in the cul-de-sac.   Assessment Plan: We had a lengthy discussion of her fibroid uterus in before she contemplates in getting pregnant I recommended that she have a myomectomy. I showed her pictures of different types of fibroids and what the operation would entail. Also because of her ovarian cysts she is going to receive a shot of Depo-Provera 150 mg IM and will return back in 3 months for follow-up ultrasound. We will obtain a ovarian cancer screening such as with a CA 125. Its limitations with the test were discussed. Literature information was provided on the following: Ovarian cyst, fibroid uterus, myomectomy, and CA 125.    Greater than 50% of time was spent in counseling and coordinating care of this patient.   Time of consultation: 15   Minutes.

## 2016-04-05 NOTE — Patient Instructions (Signed)
Myomectomy Myomectomy is surgery to remove a noncancerous tumor (myoma) from the uterus. Myomas are tumors made up of fibrous tissue. They are often called fibroid tumors. Fibroid tumors can range from the size of a pea to the size of a grapefruit. In a myomectomy, the fibroid tumor is removed without removing the uterus. Because these tumors are rarely cancerous, this surgery is usually done only if the tumor is growing or causing symptoms such as pain, pressure, bleeding, or pain with intercourse. LET [facility] CARE PROVIDER KNOW ABOUT:  Any allergies you have.  All medicines you are taking, including vitamins, herbs, eye drops, creams, and over-the-counter medicines.  Previous problems you or members of your family have had with the use of anesthetics.  Any blood disorders you have.  Previous surgeries you have had.  Medical conditions you have. RISKS AND COMPLICATIONS  Generally, this is a safe procedure. However, as with any procedure, complications can occur. Possible complications include:  Excessive bleeding.  Infection.  Injury to nearby organs.  Blood clots in the legs, chest, or brain.  Scar tissue on other organs and in the pelvis. This may require another surgery to remove the scar tissue. BEFORE THE PROCEDURE  Ask your health care provider about changing or stopping your regular medicines. Avoid taking aspirin or blood thinners as directed by your health care provider.  Do not  eat or drink anything after midnight on the night before surgery.  If you smoke, do not  smoke for 2 weeks before the surgery.  Do not  drink alcohol the day before the surgery.  Arrange for someone to drive you home after the procedure or after your hospital stay. Also arrange for someone to help you with activities during your recovery. PROCEDURE You will be given medicine to make you sleep through the procedure (general anesthetic). Any of the following methods may be used to perform a  myomectomy:  Small monitors will be put on your body. They are used to check your heart, blood pressure, and oxygen level.  An IV access tube will be put into one of your veins. Medicine will be able to flow directly into your body through this IV tube.  You might be given a medicine to help you relax (sedative).  You will be given a medicine to make you sleep (general anesthetic). A breathing tube will be placed into your lungs during the procedure.  A thin, flexible tube (catheter) will be inserted into your bladder to collect urine.  Any of the following methods may be used to perform a myomectomy:  Hysteroscopic myomectomy--This method may be used when the fibroid tumor is inside the cavity of the uterus. A long, thin tube that is like a telescope (hysteroscope) is inserted inside the uterus. A saline solution is put into your uterus. This expands the uterus and allows the surgeon to see the fibroids. Tools are passed through the hysteroscope to remove the fibroid tumor in pieces.  Laparoscopic myomectomy--A few small cuts (incisions) are made in the lower abdomen. A thin, lighted tube with a tiny camera on the end (laparoscope) is inserted through one of the incisions. This gives the surgeon a good view of the area. The fibroid tumor is removed through the other incisions. The incisions are then closed with stitches (sutures) or staples.  Abdominal myomectomy--This method is used when the fibroid tumor cannot be removed with a hysteroscope or laparoscope. The surgery is performed through a larger surgical incision in the abdomen. The  fibroid tumor is removed through this incision. The incision is closed with sutures or staples. AFTER THE PROCEDURE  If you had a laparoscopic or hysteroscopic myomectomy, you may be able to go home the same day, or you may need to stay in the hospital overnight.  If you had an abdominal myomectomy, you may need to stay in the hospital for a few days.  Your  IV access tube and catheter will be removed in 1-2 days.  You may be given medicine for pain or to help you sleep.  You may be given an antibiotic medicine, if needed.   This information is not intended to replace advice given to you by your health care provider. Make sure you discuss any questions you have with your health care provider.   Document Released: 07/30/2007 Document Revised: 07/23/2013 Document Reviewed: 05/14/2013 Elsevier Interactive Patient Education 2016 Else[employer] Inc[employer] Uterine Fibroids Uterine fibroids are tissue masses (tumors) that can develop in the womb (uterus). They are also called leiomyomas. This type of tumor is not cancerous (benign) and does not spread to other parts of the body outside of the pelvic area, which is between the hip bones. Occasionally, fibroids may develop in the fallopian tubes, in the cervix, or on the support structures (ligaments) that surround the uterus. You can have one or many fibroids. Fibroids can vary in size, weight, and where they grow in the uterus. Some can become quite large. Most fibroids do not require medical treatment. CAUSES A fibroid can develop when a single uterine cell keeps growing (replicating). Most cells in the human body have a control mechanism that keeps them from replicating without control. SIGNS AND SYMPTOMS Symptoms may include:   Heavy bleeding during your period.  Bleeding or spotting between periods.  Pelvic pain and pressure.  Bladder problems, such as needing to urinate more often (urinary frequency) or urgently.  Inability to reproduce offspring (infertility).  Miscarriages. DIAGNOSIS Uterine fibroids are diagnosed through a physical exam. Your health care provider may feel the lumpy tumors during a pelvic exam. Ultrasonography and an MRI may be done to determine the size, location, and number of fibroids. TREATMENT Treatment may include:  Watchful waiting. This involves getting the fibroid checked  by your health care provider to see if it grows or shrinks. Follow your health care provider's recommendations for how often to have this checked.  Hormone medicines. These can be taken by mouth or given through an intrauterine device (IUD).  Surgery.  Removing the fibroids (myomectomy) or the uterus (hysterectomy).  Removing blood supply to the fibroids (uterine artery embolization). If fibroids interfere with your fertility and you want to become pregnant, your health care provider may recommend having the fibroids removed.  HOME CARE INSTRUCTIONS  Keep all follow-up visits as directed by your health care provider. This is important.  Take medicines only as directed by your health care provider.  If you were prescribed a hormone treatment, take the hormone medicines exactly as directed.  Do not take aspirin, because it can cause bleeding.  Ask your health care provider about taking iron pills and increasing the amount of dark green, leafy vegetables in your diet. These actions can help to boost your blood iron levels, which may be affected by heavy menstrual bleeding.  Pay close attention to your period and tell your health care provider about any changes, such as:  Increased blood flow that requires you to use more pads or tampons than usual per month.  A change in  the number of days that your period lasts per month.  A change in symptoms that are associated with your period, such as abdominal cramping or back pain. SEEK MEDICAL CARE IF:  You have pelvic pain, back pain, or abdominal cramps that cannot be controlled with medicines.  You have an increase in bleeding between and during periods.  You soak tampons or pads in a half hour or less.  You feel lightheaded, extra tired, or weak. SEEK IMMEDIATE MEDICAL CARE IF:  You faint.  You have a sudden increase in pelvic pain.   This information is not intended to replace advice given to you by your health care provider.  Make sure you discuss any questions you have with your health care provider.   Document Released: 09/29/2000 Document Revised: 10/23/2014 Document Reviewed: 03/31/2014 Elsevier Interactive Patient Education 2016 Elsevier Inc. CA-125 Tumor Marker Test WHY AM I HAVING THIS TEST? This test is used to check the level of cancer antigen 125 (CA-125) in your blood. The CA-125 tumor marker test can be helpful in detecting ovarian cancer. The test is only performed if you are considered at high risk for ovarian cancer. Your health care provider may recommend this test if:  You have a strong family history of ovarian cancer.  You have a breast cancer antigen (BRCA) genetic defect. If you have already been diagnosed with ovarian cancer, your health care provider may use this test to help identify the extent of the disease and to monitor your response to treatment. WHAT KIND OF SAMPLE IS TAKEN? A blood sample is required for this test. It is usually collected by inserting a needle into a vein. HOW DO I PREPARE FOR THE TEST? There is no preparation required for this test. WHAT ARE THE REFERENCE RANGES? Reference ranges are considered healthy ranges established after testing a large group of healthy people. Reference ranges may vary among different people, labs, and hospitals. It is your responsibility to obtain your test results. Ask the lab or department performing the test when and how you will get your results. The reference range for this test is 0-35 units/mL or less than 35 kunits/L (SI units). WHAT DO THE RESULTS MEAN? Increased levels of CA-125 may indicate:  Certain types of cancer, including:  Ovarian cancer.  Pancreatic cancer.  Colon cancer.  Lung cancer.  Breast cancer.  Lymphoma.  Noncancerous (benign) disorders, including:  Cirrhosis.  Pregnancy.  Endometriosis.  Pancreatitis.  Pelvic inflammatory disease (PID). Talk with your health care provider to discuss your  results, treatment options, and if necessary, the need for more tests. Talk with your health care provider if you have any questions about your results.   This information is not intended to replace advice given to you by your health care provider. Make sure you discuss any questions you have with your health care provider.   Document Released: 10/24/2004 Document Revised: 10/23/2014 Document Reviewed: 02/19/2014 Elsevier Interactive Patient Education 2016 Elsevier Inc. Ovarian Cyst An ovarian cyst is a fluid-filled sac that forms on an ovary. The ovaries are small organs that produce eggs in women. Various types of cysts can form on the ovaries. Most are not cancerous. Many do not cause problems, and they often go away on their own. Some may cause symptoms and require treatment. Common types of ovarian cysts include:  Functional cysts--These cysts may occur every month during the menstrual cycle. This is normal. The cysts usually go away with the next menstrual cycle if the woman does not  get pregnant. Usually, there are no symptoms with a functional cyst.  Endometrioma cysts--These cysts form from the tissue that lines the uterus. They are also called "chocolate cysts" because they become filled with blood that turns brown. This type of cyst can cause pain in the lower abdomen during intercourse and with your menstrual period.  Cystadenoma cysts--This type develops from the cells on the outside of the ovary. These cysts can get very big and cause lower abdomen pain and pain with intercourse. This type of cyst can twist on itself, cut off its blood supply, and cause severe pain. It can also easily rupture and cause a lot of pain.  Dermoid cysts--This type of cyst is sometimes found in both ovaries. These cysts may contain different kinds of body tissue, such as skin, teeth, hair, or cartilage. They usually do not cause symptoms unless they get very big.  Theca lutein cysts--These cysts occur when  too much of a certain hormone (human chorionic gonadotropin) is produced and overstimulates the ovaries to produce an egg. This is most common after procedures used to assist with the conception of a baby (in vitro fertilization). CAUSES   Fertility drugs can cause a condition in which multiple large cysts are formed on the ovaries. This is called ovarian hyperstimulation syndrome.  A condition called polycystic ovary syndrome can cause hormonal imbalances that can lead to nonfunctional ovarian cysts. SIGNS AND SYMPTOMS  Many ovarian cysts do not cause symptoms. If symptoms are present, they may include:  Pelvic pain or pressure.  Pain in the lower abdomen.  Pain during sexual intercourse.  Increasing girth (swelling) of the abdomen.  Abnormal menstrual periods.  Increasing pain with menstrual periods.  Stopping having menstrual periods without being pregnant. DIAGNOSIS  These cysts are commonly found during a routine or annual pelvic exam. Tests may be ordered to find out more about the cyst. These tests may include:  Ultrasound.  X-ray of the pelvis.  CT scan.  MRI.  Blood tests. TREATMENT  Many ovarian cysts go away on their own without treatment. Your health care provider may want to check your cyst regularly for 2-3 months to see if it changes. For women in menopause, it is particularly important to monitor a cyst closely because of the higher rate of ovarian cancer in menopausal women. When treatment is needed, it may include any of the following:  A procedure to drain the cyst (aspiration). This may be done using a long needle and ultrasound. It can also be done through a laparoscopic procedure. This involves using a thin, lighted tube with a tiny camera on the end (laparoscope) inserted through a small incision.  Surgery to remove the whole cyst. This may be done using laparoscopic surgery or an open surgery involving a larger incision in the lower abdomen.  Hormone  treatment or birth control pills. These methods are sometimes used to help dissolve a cyst. HOME CARE INSTRUCTIONS   Only take over-the-counter or prescription medicines as directed by your health care provider.  Follow up with your health care provider as directed.  Get regular pelvic exams and Pap tests. SEEK MEDICAL CARE IF:   Your periods are late, irregular, or painful, or they stop.  Your pelvic pain or abdominal pain does not go away.  Your abdomen becomes larger or swollen.  You have pressure on your bladder or trouble emptying your bladder completely.  You have pain during sexual intercourse.  You have feelings of fullness, pressure, or discomfort in  your stomach.  You lose weight for no apparent reason.  You feel generally ill.  You become constipated.  You lose your appetite.  You develop acne.  You have an increase in body and facial hair.  You are gaining weight, without changing your exercise and eating habits.  You think you are pregnant. SEEK IMMEDIATE MEDICAL CARE IF:   You have increasing abdominal pain.  You feel sick to your stomach (nauseous), and you throw up (vomit).  You develop a fever that comes on suddenly.  You have abdominal pain during a bowel movement.  Your menstrual periods become heavier than usual. MAKE SURE YOU:  Understand these instructions.  Will watch your condition.  Will get help right away if you are not doing well or get worse.   This information is not intended to replace advice given to you by your health care provider. Make sure you discuss any questions you have with your health care provider.   Document Released: 10/02/2005 Document Revised: 10/07/2013 Document Reviewed: 06/09/2013 Elsevier Interactive Patient Education [employer].

## 2016-04-06 LAB — CA 125: CA 125: 6 U/mL (ref <35)

## 2016-05-30 ENCOUNTER — Telehealth: Payer: Self-pay | Admitting: *Deleted

## 2016-05-30 NOTE — Telephone Encounter (Signed)
Pt called and left message in triage voicemail c/o cycle with depo provera injection asked if normal. I called pt back and got her voicemail and left a detailed message normal to have breakthrough bleeding at times, as long as no heavy bleeding changing tampon/pad every hour, extreme cramping. I advised her to watch for now and to call if further questions.

## 2016-06-07 ENCOUNTER — Ambulatory Visit: Payer: BLUE CROSS/BLUE SHIELD

## 2016-06-28 ENCOUNTER — Ambulatory Visit (INDEPENDENT_AMBULATORY_CARE_PROVIDER_SITE_OTHER): Payer: BLUE CROSS/BLUE SHIELD

## 2016-06-28 ENCOUNTER — Ambulatory Visit (INDEPENDENT_AMBULATORY_CARE_PROVIDER_SITE_OTHER): Payer: BLUE CROSS/BLUE SHIELD | Admitting: Gynecology

## 2016-06-28 ENCOUNTER — Other Ambulatory Visit: Payer: Self-pay | Admitting: Gynecology

## 2016-06-28 ENCOUNTER — Encounter: Payer: Self-pay | Admitting: Gynecology

## 2016-06-28 DIAGNOSIS — N83201 Unspecified ovarian cyst, right side: Secondary | ICD-10-CM

## 2016-06-28 DIAGNOSIS — D252 Subserosal leiomyoma of uterus: Secondary | ICD-10-CM | POA: Diagnosis not present

## 2016-06-28 DIAGNOSIS — N83202 Unspecified ovarian cyst, left side: Secondary | ICD-10-CM | POA: Diagnosis not present

## 2016-06-28 DIAGNOSIS — N852 Hypertrophy of uterus: Secondary | ICD-10-CM

## 2016-06-28 DIAGNOSIS — D251 Intramural leiomyoma of uterus: Secondary | ICD-10-CM

## 2016-06-28 MED ORDER — MEDROXYPROGESTERONE ACETATE 150 MG/ML IM SUSP
150.0000 mg | Freq: Once | INTRAMUSCULAR | Status: AC
Start: 2016-06-28 — End: 2016-06-28
  Administered 2016-06-28: 150 mg via INTRAMUSCULAR

## 2016-06-28 NOTE — Progress Notes (Signed)
     HPI: Patient is a 30 year old gravida 0 who presented to the office today for follow-up ultrasound. She was seen the office on 04/05/2016 in her history is as follows:  "Patient is a 30 year old who was seen in the office for the first time in February this year where she had requested removal of her Mirena IUD her plans for her to try to get pregnant. Then she was seen the office in June of this year for her annual exam. And review of her record indicated that with another provider several years ago she had a LEEP cervical conization for CIN-3 reports normal Pap smears afterwards. She also has a history of a fibroid uterus. Her PCP has been doing her blood work at [facility]. Her Pap smear during that visit was normal. She is here today to discuss results of her ultrasound because during her exam her uterus was felt to be enlarged with a past history of fibroid uterus."  Ultrasound that day demonstrated the following: Uterus measured 9.8 59.6 x 7.3 cm with endometrial stripe is 7.1 mm. Patient was several intramural and subserosal fibroids as follows: Intramural: 7.7 x 5.1 x 6.4 cm, 4.4 x 3.3 x 3.0 cm, 2.3 x 1.4 cm, left subserosal posterior fibroid 3.4 x 3.2 x 3.5 cm with negative color flow. Right ovarian follicle cyst measuring 19 x 15 mm. Left ovarian solid mass measuring 27 x 20 x 20 mm and 19 x 13x 18 mm solid with several small cyst tiny cyst with color flow documented both. Arterial blood flow was noted also left ovary was negative otherwise no fluid in the cul-de-sac.  Patient has been complaining also recently of right lower quadrant discomfort. At the last office visit she received Depo-Provera 150 mg IM in effort to suppress the left ovarian cyst. Her CA 125 was in the normal range with a value of 6.  Ultrasound today: Uterus measured 13.9 x 9.3 x 8.6 cm has increased over the course the past 3 months. Several fibroids were described as follows intramural and serosal: 3.6  x 3.7 cm, 80.7 x 5.7 cm, 3.3 x 2.8 cm, 4.7 x 4.4 cm. A thin-walled right ovarian cystic mass with reticular echo pattern was described with a measurement of 4.1 x 2.7 x 4.0 cm average size 3.6 cm. Left ovary solid mass no longer present and no fluid in the cul-de-sac.  Assessment/plan: Patient with multiple fibroids now symptomatic with a right ovarian cyst previous cyst left ovary resolved after Depo-Provera 150 mg IM. Because of patient's work schedule she would like to schedule her surgery if possible for November 14. In the meantime we will give her another shot of Depo-Provera 150 mg IM and recheck her CA 125. She will return to the office a week before surgery for her preop exam. I have provided her with literature information abdominal myomectomy and cystectomy. We briefly discussed the operation in the time that she would be needed to be out of work. We also discussed chromopertubation at the same time.  Greater than 50% time was spent counseling quantity care of this patient was systematic fibroid and ovarian cyst.         R

## 2016-06-28 NOTE — Patient Instructions (Addendum)
CA-125 Tumor Marker Test WHY AM I HAVING THIS TEST? This test is used to check the level of cancer antigen 125 (CA-125) in your blood. The CA-125 tumor marker test can be helpful in detecting ovarian cancer. The test is only performed if you are considered at high risk for ovarian cancer. Your health care provider may recommend this test if:  You have a strong family history of ovarian cancer.  You have a breast cancer antigen (BRCA) genetic defect. If you have already been diagnosed with ovarian cancer, your health care provider may use this test to help identify the extent of the disease and to monitor your response to treatment. WHAT KIND OF SAMPLE IS TAKEN? A blood sample is required for this test. It is usually collected by inserting a needle into a vein. HOW DO I PREPARE FOR THE TEST? There is no preparation required for this test. WHAT ARE THE REFERENCE RANGES? Reference ranges are considered healthy ranges established after testing a large group of healthy people. Reference ranges may vary among different people, labs, and hospitals. It is your responsibility to obtain your test results. Ask the lab or department performing the test when and how you will get your results. The reference range for this test is 0-35 units/mL or less than 35 kunits/L (SI units). WHAT DO THE RESULTS MEAN? Increased levels of CA-125 may indicate:  Certain types of cancer, including:  Ovarian cancer.  Pancreatic cancer.  Colon cancer.  Lung cancer.  Breast cancer.  Lymphoma.  Noncancerous (benign) disorders, including:  Cirrhosis.  Pregnancy.  Endometriosis.  Pancreatitis.  Pelvic inflammatory disease (PID). Talk with your health care provider to discuss your results, treatment options, and if necessary, the need for more tests. Talk with your health care provider if you have any questions about your results.   This information is not intended to replace advice given to you by your  health care provider. Make sure you discuss any questions you have with your health care provider.   Document Released: 10/24/2004 Document Revised: 10/23/2014 Document Reviewed: 02/19/2014 Elsevier Interactive Patient Education 2016 [employer] Ovarian Cyst An ovarian cyst is a fluid-filled sac that forms on an ovary. The ovaries are small organs that produce eggs in women. Various types of cysts can form on the ovaries. Most are not cancerous. Many do not cause problems, and they often go away on their own. Some may cause symptoms and require treatment. Common types of ovarian cysts include:  Functional cysts--These cysts may occur every month during the menstrual cycle. This is normal. The cysts usually go away with the next menstrual cycle if the woman does not get pregnant. Usually, there are no symptoms with a functional cyst.  Endometrioma cysts--These cysts form from the tissue that lines the uterus. They are also called "chocolate cysts" because they become filled with blood that turns brown. This type of cyst can cause pain in the lower abdomen during intercourse and with your menstrual period.  Cystadenoma cysts--This type develops from the cells on the outside of the ovary. These cysts can get very big and cause lower abdomen pain and pain with intercourse. This type of cyst can twist on itself, cut off its blood supply, and cause severe pain. It can also easily rupture and cause a lot of pain.  Dermoid cysts--This type of cyst is sometimes found in both ovaries. These cysts may contain different kinds of body tissue, such as skin, teeth, hair, or cartilage. They usually do not cause  symptoms unless they get very big.  Theca lutein cysts--These cysts occur when too much of a certain hormone (human chorionic gonadotropin) is produced and overstimulates the ovaries to produce an egg. This is most common after procedures used to assist with the conception of a baby (in vitro  fertilization). CAUSES   Fertility drugs can cause a condition in which multiple large cysts are formed on the ovaries. This is called ovarian hyperstimulation syndrome.  A condition called polycystic ovary syndrome can cause hormonal imbalances that can lead to nonfunctional ovarian cysts. SIGNS AND SYMPTOMS  Many ovarian cysts do not cause symptoms. If symptoms are present, they may include:  Pelvic pain or pressure.  Pain in the lower abdomen.  Pain during sexual intercourse.  Increasing girth (swelling) of the abdomen.  Abnormal menstrual periods.  Increasing pain with menstrual periods.  Stopping having menstrual periods without being pregnant. DIAGNOSIS  These cysts are commonly found during a routine or annual pelvic exam. Tests may be ordered to find out more about the cyst. These tests may include:  Ultrasound.  X-ray of the pelvis.  CT scan.  MRI.  Blood tests. TREATMENT  Many ovarian cysts go away on their own without treatment. Your health care provider may want to check your cyst regularly for 2-3 months to see if it changes. For women in menopause, it is particularly important to monitor a cyst closely because of the higher rate of ovarian cancer in menopausal women. When treatment is needed, it may include any of the following:  A procedure to drain the cyst (aspiration). This may be done using a long needle and ultrasound. It can also be done through a laparoscopic procedure. This involves using a thin, lighted tube with a tiny camera on the end (laparoscope) inserted through a small incision.  Surgery to remove the whole cyst. This may be done using laparoscopic surgery or an open surgery involving a larger incision in the lower abdomen.  Hormone treatment or birth control pills. These methods are sometimes used to help dissolve a cyst. HOME CARE INSTRUCTIONS   Only take over-the-counter or prescription medicines as directed by your health care  provider.  Follow up with your health care provider as directed.  Get regular pelvic exams and Pap tests. SEEK MEDICAL CARE IF:   Your periods are late, irregular, or painful, or they stop.  Your pelvic pain or abdominal pain does not go away.  Your abdomen becomes larger or swollen.  You have pressure on your bladder or trouble emptying your bladder completely.  You have pain during sexual intercourse.  You have feelings of fullness, pressure, or discomfort in your stomach.  You lose weight for no apparent reason.  You feel generally ill.  You become constipated.  You lose your appetite.  You develop acne.  You have an increase in body and facial hair.  You are gaining weight, without changing your exercise and eating habits.  You think you are pregnant. SEEK IMMEDIATE MEDICAL CARE IF:   You have increasing abdominal pain.  You feel sick to your stomach (nauseous), and you throw up (vomit).  You develop a fever that comes on suddenly.  You have abdominal pain during a bowel movement.  Your menstrual periods become heavier than usual. MAKE SURE YOU:  Understand these instructions.  Will watch your condition.  Will get help right away if you are not doing well or get worse.   This information is not intended to replace advice given to  you by your health care provider. Make sure you discuss any questions you have with your health care provider.   Document Released: 10/02/2005 Document Revised: 10/07/2013 Document Reviewed: 06/09/2013 Elsevier Interactive Patient Education 2016 [employer] Myomectomy Myomectomy is surgery to remove a noncancerous tumor (myoma) from the uterus. Myomas are tumors made up of fibrous tissue. They are often called fibroid tumors. Fibroid tumors can range from the size of a pea to the size of a grapefruit. In a myomectomy, the fibroid tumor is removed without removing the uterus. Because these tumors are rarely cancerous, this  surgery is usually done only if the tumor is growing or causing symptoms such as pain, pressure, bleeding, or pain with intercourse. LET [facility] CARE PROVIDER KNOW ABOUT:  Any allergies you have.  All medicines you are taking, including vitamins, herbs, eye drops, creams, and over-the-counter medicines.  Previous problems you or members of your family have had with the use of anesthetics.  Any blood disorders you have.  Previous surgeries you have had.  Medical conditions you have. RISKS AND COMPLICATIONS  Generally, this is a safe procedure. However, as with any procedure, complications can occur. Possible complications include:  Excessive bleeding.  Infection.  Injury to nearby organs.  Blood clots in the legs, chest, or brain.  Scar tissue on other organs and in the pelvis. This may require another surgery to remove the scar tissue. BEFORE THE PROCEDURE  Ask your health care provider about changing or stopping your regular medicines. Avoid taking aspirin or blood thinners as directed by your health care provider.  Do not  eat or drink anything after midnight on the night before surgery.  If you smoke, do not  smoke for 2 weeks before the surgery.  Do not  drink alcohol the day before the surgery.  Arrange for someone to drive you home after the procedure or after your hospital stay. Also arrange for someone to help you with activities during your recovery. PROCEDURE You will be given medicine to make you sleep through the procedure (general anesthetic). Any of the following methods may be used to perform a myomectomy:  Small monitors will be put on your body. They are used to check your heart, blood pressure, and oxygen level.  An IV access tube will be put into one of your veins. Medicine will be able to flow directly into your body through this IV tube.  You might be given a medicine to help you relax (sedative).  You will be given a medicine to make you sleep  (general anesthetic). A breathing tube will be placed into your lungs during the procedure.  A thin, flexible tube (catheter) will be inserted into your bladder to collect urine.  Any of the following methods may be used to perform a myomectomy:  Hysteroscopic myomectomy--This method may be used when the fibroid tumor is inside the cavity of the uterus. A long, thin tube that is like a telescope (hysteroscope) is inserted inside the uterus. A saline solution is put into your uterus. This expands the uterus and allows the surgeon to see the fibroids. Tools are passed through the hysteroscope to remove the fibroid tumor in pieces.  Laparoscopic myomectomy--A few small cuts (incisions) are made in the lower abdomen. A thin, lighted tube with a tiny camera on the end (laparoscope) is inserted through one of the incisions. This gives the surgeon a good view of the area. The fibroid tumor is removed through the other incisions. The incisions are  then closed with stitches (sutures) or staples.  Abdominal myomectomy--This method is used when the fibroid tumor cannot be removed with a hysteroscope or laparoscope. The surgery is performed through a larger surgical incision in the abdomen. The fibroid tumor is removed through this incision. The incision is closed with sutures or staples. AFTER THE PROCEDURE  If you had a laparoscopic or hysteroscopic myomectomy, you may be able to go home the same day, or you may need to stay in the hospital overnight.  If you had an abdominal myomectomy, you may need to stay in the hospital for a few days.  Your IV access tube and catheter will be removed in 1-2 days.  You may be given medicine for pain or to help you sleep.  You may be given an antibiotic medicine, if needed.   This information is not intended to replace advice given to you by your health care provider. Make sure you discuss any questions you have with your health care provider.   Document Released:  07/30/2007 Document Revised: 07/23/2013 Document Reviewed: 05/14/2013 Elsevier Interactive Patient Education [employer].

## 2016-06-30 ENCOUNTER — Other Ambulatory Visit: Payer: BLUE CROSS/BLUE SHIELD

## 2016-07-01 LAB — CA 125: CA 125: 6 U/mL (ref <35)

## 2016-07-11 ENCOUNTER — Ambulatory Visit (INDEPENDENT_AMBULATORY_CARE_PROVIDER_SITE_OTHER): Payer: BLUE CROSS/BLUE SHIELD | Admitting: Gynecology

## 2016-07-11 ENCOUNTER — Encounter: Payer: Self-pay | Admitting: Gynecology

## 2016-07-11 VITALS — BP 134/88

## 2016-07-11 DIAGNOSIS — N83201 Unspecified ovarian cyst, right side: Secondary | ICD-10-CM | POA: Diagnosis not present

## 2016-07-11 DIAGNOSIS — R102 Pelvic and perineal pain: Secondary | ICD-10-CM

## 2016-07-11 DIAGNOSIS — D251 Intramural leiomyoma of uterus: Secondary | ICD-10-CM | POA: Diagnosis not present

## 2016-07-11 LAB — URINALYSIS W MICROSCOPIC + REFLEX CULTURE
BACTERIA UA: NONE SEEN [HPF]
Bilirubin Urine: NEGATIVE
CASTS: NONE SEEN [LPF]
CRYSTALS: NONE SEEN [HPF]
Glucose, UA: NEGATIVE
Hgb urine dipstick: NEGATIVE
Leukocytes, UA: NEGATIVE
Nitrite: NEGATIVE
PROTEIN: NEGATIVE
RBC / HPF: NONE SEEN RBC/HPF (ref <=2)
SPECIFIC GRAVITY, URINE: 1.02 (ref 1.001–1.035)
WBC, UA: NONE SEEN WBC/HPF (ref <=5)
Yeast: NONE SEEN [HPF]
pH: 6.5 (ref 5.0–8.0)

## 2016-07-11 LAB — CBC WITH DIFFERENTIAL/PLATELET
Basophils Absolute: 0 cells/uL (ref 0–200)
Basophils Relative: 0 %
EOS PCT: 1 %
Eosinophils Absolute: 84 cells/uL (ref 15–500)
HCT: 37.7 % (ref 35.0–45.0)
Hemoglobin: 12.5 g/dL (ref 11.7–15.5)
LYMPHS PCT: 19 %
Lymphs Abs: 1596 cells/uL (ref 850–3900)
MCH: 27.2 pg (ref 27.0–33.0)
MCHC: 33.2 g/dL (ref 32.0–36.0)
MCV: 82 fL (ref 80.0–100.0)
MPV: 10.3 fL (ref 7.5–12.5)
Monocytes Absolute: 588 cells/uL (ref 200–950)
Monocytes Relative: 7 %
NEUTROS PCT: 73 %
Neutro Abs: 6132 cells/uL (ref 1500–7800)
Platelets: 235 10*3/uL (ref 140–400)
RBC: 4.6 MIL/uL (ref 3.80–5.10)
RDW: 18 % — AB (ref 11.0–15.0)
WBC: 8.4 10*3/uL (ref 3.8–10.8)

## 2016-07-11 LAB — PREGNANCY, URINE: PREG TEST UR: NEGATIVE

## 2016-07-11 MED ORDER — KETOROLAC TROMETHAMINE 60 MG/2ML IM SOLN
60.0000 mg | Freq: Once | INTRAMUSCULAR | Status: AC
  Administered 2016-07-11: 60 mg via INTRAMUSCULAR

## 2016-07-11 MED ORDER — KETOROLAC TROMETHAMINE 30 MG/ML IJ SOLN: 60.0000 mg | Freq: Once | INTRAMUSCULAR | Status: DC

## 2016-07-11 MED ORDER — MEDROXYPROGESTERONE ACETATE 150 MG/ML IM SUSP
150.0000 mg | Freq: Once | INTRAMUSCULAR | Status: AC
Start: 2016-07-11 — End: 2016-07-11
  Administered 2016-07-11: 150 mg via INTRAMUSCULAR

## 2016-07-11 MED ORDER — KETOROLAC TROMETHAMINE 10 MG PO TABS: 10.0000 mg | ORAL_TABLET | Freq: Four times a day (QID) | ORAL | 2 refills | Status: DC | PRN

## 2016-07-11 NOTE — Patient Instructions (Signed)
Ketorolac tablets What is this medicine? KETOROLAC (kee toe ROLE ak) is a non-steroidal anti-inflammatory drug (NSAID). It is used for a short while to treat moderate to severe pain, including pain after surgery. It should not be used for more than 5 days. This medicine may be used for other purposes; ask your health care provider or pharmacist if you have questions. What should I tell my health care provider before I take this medicine? They need to know if you have any of these conditions: -asthma -bleeding problems like hemophilia -cigarette smoker -drink more than 3 alcohol containing drinks a day -heart disease or circulation problems such as heart failure or leg edema (fluid retention) -high blood pressure -kidney disease -liver disease -stomach bleeding or ulcers -an unusual or allergic reaction to ketorolac, aspirin, other NSAIDs, other medicines, foods, dyes, or preservatives -pregnant or trying to get pregnant -breast-feeding How should I use this medicine? Take this medicine by mouth with a full glass of water. Follow the directions on the prescription label. Take your medicine at regular intervals. Do not take your medicine more often than directed. Do not take more than the recommended dose. A special MedGuide will be given to you by the pharmacist with each prescription and refill. Be sure to read this information carefully each time. Talk to your pediatrician regarding the use of this medicine in children. While this drug may be prescribed for children as young as 55 years of age for selected conditions, precautions do apply. Patients over 21 years old may have a stronger reaction and need a smaller dose. Overdosage: If you think you have taken too much of this medicine contact a poison control center or emergency room at once. NOTE: This medicine is only for you. Do not share this medicine with others. What if I miss a dose? If you miss a dose, take it as soon as you can. If  it is almost time for your next dose, take only that dose. Do not take double or extra doses. What may interact with this medicine? Do not take this medicine with any of the following medications: -aspirin and aspirin-like medicines -cidofovir -methotrexate -NSAIDs, medicines for pain and inflammation, like ibuprofen or naproxen -pemetrexed -probenecid This medicine may also interact with the following medications: -alcohol -alendronate -alprazolam -carbamazepine -cyclosporine -diuretics -flavocoxid -fluoxetine -ginkgo -lithium -medicines for high blood pressure like enalapril -medicines that affect platelets like pentoxifylline -medicines that treat or prevent blood clots like heparin, warfarin -muscle relaxants -phenytoin -steroid medicines like prednisone or cortisone -thiothixene This list may not describe all possible interactions. Give your health care provider a list of all the medicines, herbs, non-prescription drugs, or dietary supplements you use. Also tell them if you smoke, drink alcohol, or use illegal drugs. Some items may interact with your medicine. What should I watch for while using this medicine? Tell your doctor or health care professional if your pain does not get better. Talk to your doctor before taking another medicine for pain. Do not treat yourself. This medicine does not prevent heart attack or stroke. In fact, this medicine may increase the chance of a heart attack or stroke. The chance may increase with longer use of this medicine and in people who have heart disease. If you take aspirin to prevent heart attack or stroke, talk with your doctor or health care professional. Do not take medicines such as ibuprofen and naproxen with this medicine. Side effects such as stomach upset, nausea, or ulcers may be more likely to  occur. Many medicines available without a prescription should not be taken with this medicine. This medicine can cause ulcers and bleeding  in the stomach and intestines at any time during treatment. Do not smoke cigarettes or drink alcohol. These increase irritation to your stomach and can make it more susceptible to damage from this medicine. Ulcers and bleeding can happen without warning symptoms and can cause death. You may get drowsy or dizzy. Do not drive, use machinery, or do anything that needs mental alertness until you know how this medicine affects you. Do not stand or sit up quickly, especially if you are an older patient. This reduces the risk of dizzy or fainting spells. This medicine can cause you to bleed more easily. Try to avoid damage to your teeth and gums when you brush or floss your teeth. What side effects may I notice from receiving this medicine? Side effects that you should report to your doctor or health care professional as soon as possible: -allergic reactions like skin rash, itching or hives, swelling of the face, lips, or tongue -breathing problems -high blood pressure -nausea, vomiting -redness, blistering, peeling or loosening of the skin, including inside the mouth -severe stomach pain -signs and symptoms of bleeding such as bloody or black, tarry stools; red or dark-brown urine; spitting up blood or brown material that looks like coffee grounds; red spots on the skin; unusual bruising or bleeding from the eye, gums, or nose -signs and symptoms of a stroke like changes in vision; confusion; trouble speaking or understanding; severe headaches; sudden numbness or weakness of the face, arm or leg; trouble walking; dizziness; loss of balance or coordination -trouble passing urine or change in the amount of urine -unexplained weight gain or swelling -unusually weak or tired -yellowing of eyes or skin Side effects that usually do not require medical attention (report to your doctor or health care professional if they continue or are bothersome): -diarrhea -dizziness -headache -heartburn This list may not  describe all possible side effects. Call your doctor for medical advice about side effects. You may report side effects to FDA at 1-800-FDA-1088. Where should I keep my medicine? Keep out of the reach of children. Store at room temperature between 20 and 25 degrees C (68 and 77 degrees F). Throw away any unused medicine after the expiration date. NOTE: This sheet is a summary. It may not cover all possible information. If you have questions about this medicine, talk to your doctor, pharmacist, or health care provider.    2016, [employer]. (2013-02-18 16:36:05) Ketorolac injection What is this medicine? KETOROLAC (kee toe ROLE ak) is a non-steroidal anti-inflammatory drug (NSAID). It is used to treat moderate to severe pain for up to 5 days. It is commonly used after surgery. This medicine should not be used for more than 5 days. This medicine may be used for other purposes; ask your health care provider or pharmacist if you have questions. What should I tell my health care provider before I take this medicine? They need to know if you have any of these conditions: -asthma, especially aspirin-sensitive asthma -bleeding problems -kidney disease -stomach bleed, ulcer, or other problem -taking aspirin, other NSAID, or probenecid -an unusual or allergic reaction to ketorolac, tromethamine, aspirin, other NSAIDs, other medicines, foods, dyes or preservatives -pregnant or trying to get pregnant -breast-feeding How should I use this medicine? This medicine is for injection into a muscle or into a vein. It is given by a health care professional in a hospital  or clinic setting. Talk to your pediatrician regarding the use of this medicine in children. While this drug may be prescribed for children as young as 15 years old for selected conditions, precautions do apply. Patients over 87 years old may have a stronger reaction and need a smaller dose. Overdosage: If you think you have taken too  much of this medicine contact a poison control center or emergency room at once. NOTE: This medicine is only for you. Do not share this medicine with others. What if I miss a dose? This does not apply. What may interact with this medicine? Do not take this medicine with any of the following medications: -aspirin and aspirin-like medicines -cidofovir -methotrexate -NSAIDs, medicines for pain and inflammation, like ibuprofen or naproxen -pentoxifylline -probenecid This medicine may also interact with the following medications: -alcohol -alendronate -alprazolam -carbamazepine -diuretics -flavocoxid -fluoxetine -ginkgo -lithium -medicines for blood pressure like enalapril -medicines that affect platelets like pentoxifylline -medicines that treat or prevent blood clots like heparin, warfarin -muscle relaxants -pemetrexed -phenytoin -thiothixene This list may not describe all possible interactions. Give your health care provider a list of all the medicines, herbs, non-prescription drugs, or dietary supplements you use. Also tell them if you smoke, drink alcohol, or use illegal drugs. Some items may interact with your medicine. What should I watch for while using this medicine? Tell your doctor or healthcare professional if your symptoms do not start to get better or if they get worse. This medicine does not prevent heart attack or stroke. In fact, this medicine may increase the chance of a heart attack or stroke. The chance may increase with longer use of this medicine and in people who have heart disease. If you take aspirin to prevent heart attack or stroke, talk with your doctor or health care professional. Do not take medicines such as ibuprofen and naproxen with this medicine. Side effects such as stomach upset, nausea, or ulcers may be more likely to occur. Many medicines available without a prescription should not be taken with this medicine. This medicine can cause ulcers and  bleeding in the stomach and intestines at any time during treatment. Do not smoke cigarettes or drink alcohol. These increase irritation to your stomach and can make it more susceptible to damage from this medicine. Ulcers and bleeding can happen without warning symptoms and can cause death. This medicine can cause you to bleed more easily. Try to avoid damage to your teeth and gums when you brush or floss your teeth. What side effects may I notice from receiving this medicine? Side effects that you should report to your doctor or health care professional as soon as possible: -allergic reactions like skin rash, itching or hives, swelling of the face, lips, or tongue -breathing problems -high blood pressure -nausea, vomiting -redness, blistering, peeling or loosening of the skin, including inside the mouth -severe stomach pain -signs and symptoms of bleeding such as bloody or black, tarry stools; red or dark-brown urine; spitting up blood or brown material that looks like coffee grounds; red spots on the skin; unusual bruising or bleeding from the eye, gums, or nose -signs and symptoms of a blood clot changes in vision; chest pain; severe, sudden headache; trouble speaking; sudden numbness or weakness of the face, arm, or leg -trouble passing urine or change in the amount of urine -unexplained weight gain or swelling -unusually weak or tired -yellowing of eyes or skin Side effects that usually do not require medical attention (report to your doctor or  health care professional if they continue or are bothersome): -diarrhea -dizziness -headache -heartburn This list may not describe all possible side effects. Call your doctor for medical advice about side effects. You may report side effects to FDA at 1-800-FDA-1088. Where should I keep my medicine? This drug is given in a hospital or clinic and will not be stored at home. NOTE: This sheet is a summary. It may not cover all possible information.  If you have questions about this medicine, talk to your doctor, pharmacist, or health care provider.    2016, [employer] Standard. (2013-02-18 16:34:56) Ovarian Cyst An ovarian cyst is a fluid-filled sac that forms on an ovary. The ovaries are small organs that produce eggs in women. Various types of cysts can form on the ovaries. Most are not cancerous. Many do not cause problems, and they often go away on their own. Some may cause symptoms and require treatment. Common types of ovarian cysts include:  Functional cysts--These cysts may occur every month during the menstrual cycle. This is normal. The cysts usually go away with the next menstrual cycle if the woman does not get pregnant. Usually, there are no symptoms with a functional cyst.  Endometrioma cysts--These cysts form from the tissue that lines the uterus. They are also called "chocolate cysts" because they become filled with blood that turns brown. This type of cyst can cause pain in the lower abdomen during intercourse and with your menstrual period.  Cystadenoma cysts--This type develops from the cells on the outside of the ovary. These cysts can get very big and cause lower abdomen pain and pain with intercourse. This type of cyst can twist on itself, cut off its blood supply, and cause severe pain. It can also easily rupture and cause a lot of pain.  Dermoid cysts--This type of cyst is sometimes found in both ovaries. These cysts may contain different kinds of body tissue, such as skin, teeth, hair, or cartilage. They usually do not cause symptoms unless they get very big.  Theca lutein cysts--These cysts occur when too much of a certain hormone (human chorionic gonadotropin) is produced and overstimulates the ovaries to produce an egg. This is most common after procedures used to assist with the conception of a baby (in vitro fertilization). CAUSES   Fertility drugs can cause a condition in which multiple large cysts are formed  on the ovaries. This is called ovarian hyperstimulation syndrome.  A condition called polycystic ovary syndrome can cause hormonal imbalances that can lead to nonfunctional ovarian cysts. SIGNS AND SYMPTOMS  Many ovarian cysts do not cause symptoms. If symptoms are present, they may include:  Pelvic pain or pressure.  Pain in the lower abdomen.  Pain during sexual intercourse.  Increasing girth (swelling) of the abdomen.  Abnormal menstrual periods.  Increasing pain with menstrual periods.  Stopping having menstrual periods without being pregnant. DIAGNOSIS  These cysts are commonly found during a routine or annual pelvic exam. Tests may be ordered to find out more about the cyst. These tests may include:  Ultrasound.  X-ray of the pelvis.  CT scan.  MRI.  Blood tests. TREATMENT  Many ovarian cysts go away on their own without treatment. Your health care provider may want to check your cyst regularly for 2-3 months to see if it changes. For women in menopause, it is particularly important to monitor a cyst closely because of the higher rate of ovarian cancer in menopausal women. When treatment is needed, it may include any of  the following:  A procedure to drain the cyst (aspiration). This may be done using a long needle and ultrasound. It can also be done through a laparoscopic procedure. This involves using a thin, lighted tube with a tiny camera on the end (laparoscope) inserted through a small incision.  Surgery to remove the whole cyst. This may be done using laparoscopic surgery or an open surgery involving a larger incision in the lower abdomen.  Hormone treatment or birth control pills. These methods are sometimes used to help dissolve a cyst. HOME CARE INSTRUCTIONS   Only take over-the-counter or prescription medicines as directed by your health care provider.  Follow up with your health care provider as directed.  Get regular pelvic exams and Pap tests. SEEK  MEDICAL CARE IF:   Your periods are late, irregular, or painful, or they stop.  Your pelvic pain or abdominal pain does not go away.  Your abdomen becomes larger or swollen.  You have pressure on your bladder or trouble emptying your bladder completely.  You have pain during sexual intercourse.  You have feelings of fullness, pressure, or discomfort in your stomach.  You lose weight for no apparent reason.  You feel generally ill.  You become constipated.  You lose your appetite.  You develop acne.  You have an increase in body and facial hair.  You are gaining weight, without changing your exercise and eating habits.  You think you are pregnant. SEEK IMMEDIATE MEDICAL CARE IF:   You have increasing abdominal pain.  You feel sick to your stomach (nauseous), and you throw up (vomit).  You develop a fever that comes on suddenly.  You have abdominal pain during a bowel movement.  Your menstrual periods become heavier than usual. MAKE SURE YOU:  Understand these instructions.  Will watch your condition.  Will get help right away if you are not doing well or get worse.   This information is not intended to replace advice given to you by your health care provider. Make sure you discuss any questions you have with your health care provider.   Document Released: 10/02/2005 Document Revised: 10/07/2013 Document Reviewed: 06/09/2013 Elsevier Interactive Patient Education 2016 [employer] Uterine Fibroids Uterine fibroids are tissue masses (tumors) that can develop in the womb (uterus). They are also called leiomyomas. This type of tumor is not cancerous (benign) and does not spread to other parts of the body outside of the pelvic area, which is between the hip bones. Occasionally, fibroids may develop in the fallopian tubes, in the cervix, or on the support structures (ligaments) that surround the uterus. You can have one or many fibroids. Fibroids can vary in size,  weight, and where they grow in the uterus. Some can become quite large. Most fibroids do not require medical treatment. CAUSES A fibroid can develop when a single uterine cell keeps growing (replicating). Most cells in the human body have a control mechanism that keeps them from replicating without control. SIGNS AND SYMPTOMS Symptoms may include:   Heavy bleeding during your period.  Bleeding or spotting between periods.  Pelvic pain and pressure.  Bladder problems, such as needing to urinate more often (urinary frequency) or urgently.  Inability to reproduce offspring (infertility).  Miscarriages. DIAGNOSIS Uterine fibroids are diagnosed through a physical exam. Your health care provider may feel the lumpy tumors during a pelvic exam. Ultrasonography and an MRI may be done to determine the size, location, and number of fibroids. TREATMENT Treatment may include:  Watchful waiting.  This involves getting the fibroid checked by your health care provider to see if it grows or shrinks. Follow your health care provider's recommendations for how often to have this checked.  Hormone medicines. These can be taken by mouth or given through an intrauterine device (IUD).  Surgery.  Removing the fibroids (myomectomy) or the uterus (hysterectomy).  Removing blood supply to the fibroids (uterine artery embolization). If fibroids interfere with your fertility and you want to become pregnant, your health care provider may recommend having the fibroids removed.  HOME CARE INSTRUCTIONS  Keep all follow-up visits as directed by your health care provider. This is important.  Take medicines only as directed by your health care provider.  If you were prescribed a hormone treatment, take the hormone medicines exactly as directed.  Do not take aspirin, because it can cause bleeding.  Ask your health care provider about taking iron pills and increasing the amount of dark green, leafy vegetables in  your diet. These actions can help to boost your blood iron levels, which may be affected by heavy menstrual bleeding.  Pay close attention to your period and tell your health care provider about any changes, such as:  Increased blood flow that requires you to use more pads or tampons than usual per month.  A change in the number of days that your period lasts per month.  A change in symptoms that are associated with your period, such as abdominal cramping or back pain. SEEK MEDICAL CARE IF:  You have pelvic pain, back pain, or abdominal cramps that cannot be controlled with medicines.  You have an increase in bleeding between and during periods.  You soak tampons or pads in a half hour or less.  You feel lightheaded, extra tired, or weak. SEEK IMMEDIATE MEDICAL CARE IF:  You faint.  You have a sudden increase in pelvic pain.   This information is not intended to replace advice given to you by your health care provider. Make sure you discuss any questions you have with your health care provider.   Document Released: 09/29/2000 Document Revised: 10/23/2014 Document Reviewed: 03/31/2014 Elsevier Interactive Patient Education [employer].

## 2016-07-11 NOTE — Addendum Note (Signed)
Addended by: Thurnell Garbe A on: 07/11/2016 12:40 PM   Modules accepted: Orders

## 2016-07-11 NOTE — Progress Notes (Signed)
Patient is 30 year old gravida 0 who presented to the office today complaining of severe right lower abdominal pain last night and up to this morning it comes and goes. Review of her record indicated she has a large fibroid uterus and is scheduled for surgery sometime in November. She also has a right ovarian cyst. On the last office visit on 04/05/2016 she received an injection of Depo-Provera. She reports having had a menstrual cycle that lasted 3 weeks and ended on 05/27/2016. Patient denies any nausea, vomiting, fever, chills, or any GU or GI complaints.  Her history is as follows: She was seen the office on 04/05/2016 in her history is as follows:  "Patient is a 30 year old who was seen in the office for the first time in February this year where she had requested removal of her Mirena IUD her plans for her to try to get pregnant. Then she was seen the office in June of this year for her annual exam. And review of her record indicated that with another provider several years ago she had a LEEP cervical conization for CIN-3 reports normal Pap smears afterwards. She also has a history of a fibroid uterus. Her PCP has been doing her blood work at [facility]. Her Pap smear during that visit was normal. She is here today to discuss results of her ultrasound because during her exam her uterus was felt to be enlarged with a past history of fibroid uterus."  Ultrasound that day demonstrated the following: Uterus measured 9.8 59.6 x 7.3 cm with endometrial stripe is 7.1 mm. Patient was several intramural and subserosal fibroids as follows: Intramural: 7.7 x 5.1 x 6.4 cm, 4.4 x 3.3 x 3.0 cm, 2.3 x 1.4 cm, left subserosal posterior fibroid 3.4 x 3.2 x 3.5 cm with negative color flow. Right ovarian follicle cyst measuring 19 x 15 mm. Left ovarian solid mass measuring 27 x 20 x 20 mm and 19 x 13x 18 mm solid with several small cyst tiny cyst with color flow documented both. Arterial blood flow  was noted also left ovary was negative otherwise no fluid in the cul-de-sac.  Patient has been complaining also recently of right lower quadrant discomfort. At the last office visit she received Depo-Provera 150 mg IM in effort to suppress the left ovarian cyst. Her CA 125 was in the normal range with a value of 6.  Ultrasound 06/28/2016:: Uterus measured 13.9 x 9.3 x 8.6 cm has increased over the course the past 3 months. Several fibroids were described as follows intramural and serosal: 3.6 x 3.7 cm, 80.7 x 5.7 cm, 3.3 x 2.8 cm, 4.7 x 4.4 cm. A thin-walled right ovarian cystic mass with reticular echo pattern was described with a measurement of 4.1 x 2.7 x 4.0 cm average size 3.6 cm. Left ovary solid mass no longer present and no fluid in the cul-de-sac.  The assessment at that office visit had indicated that her pre-existing right ovarian cysts had resolved.   Exam: Blood pressure 134/88 Gen. appearance well-developed C no the above-mentioned complaint Back: No CVA tenderness Abdomen soft no rebound or guarding slightly tender lower abdomen. Pelvic: Bartholin urethra Skene was within normal limits Vagina: No gross lesions on inspection Cervix: No lesions or discharge Uterus 14 week size firm nontender Rectal vaginal exam not done Adnexa difficult to palpate due to the size of fibroid uterus  Urinalysis negative  Urine presented test negative  Assessment and plan: Patient with fibroid uterus history of ovarian cyst  possible underlying endometriosis patient was given Depo-Provera 150 mg IM and also shot of Toradol 60 mg IM as well as a prescription for Toradol 10 mg 1 by mouth every 6 hours when necessary. Patient scheduled to return back to the office one week prior to her planned abdominal myomectomy November. We'll going to check her CBC today as well.

## 2016-08-22 ENCOUNTER — Encounter: Payer: Self-pay | Admitting: Anesthesiology

## 2016-08-29 ENCOUNTER — Encounter (HOSPITAL_COMMUNITY): Payer: Self-pay

## 2016-08-29 ENCOUNTER — Encounter (HOSPITAL_COMMUNITY)
Admission: RE | Admit: 2016-08-29 | Discharge: 2016-08-29 | Disposition: A | Discharge status: Home / Self Care | Payer: BLUE CROSS/BLUE SHIELD | Source: Ambulatory Visit | Attending: Gynecology | Admitting: Gynecology

## 2016-08-29 DIAGNOSIS — Z01818 Encounter for other preprocedural examination: Secondary | ICD-10-CM | POA: Diagnosis not present

## 2016-08-29 LAB — CBC
HEMATOCRIT: 42.1 % (ref 36.0–46.0)
HEMOGLOBIN: 14.3 g/dL (ref 12.0–15.0)
MCH: 29.1 pg (ref 26.0–34.0)
MCHC: 34 g/dL (ref 30.0–36.0)
MCV: 85.7 fL (ref 78.0–100.0)
Platelets: 234 10*3/uL (ref 150–400)
RBC: 4.91 MIL/uL (ref 3.87–5.11)
RDW: 16.7 % — ABNORMAL HIGH (ref 11.5–15.5)
WBC: 8.7 10*3/uL (ref 4.0–10.5)

## 2016-08-29 LAB — TYPE AND SCREEN
ABO/RH(D): O POS
ANTIBODY SCREEN: NEGATIVE

## 2016-08-29 LAB — ABO/RH: ABO/RH(D): O POS

## 2016-08-29 NOTE — Patient Instructions (Signed)
Your procedure is scheduled on:  Tuesday, Nov. 28, 2017  Enter through the [employer] of [facility] at:  10:00 AM  Pick up the phone at the desk and dial [phone].  Call this number if you have problems the morning of surgery: [phone].  Remember: Do NOT eat food or drink after:  Midnight Monday, Nov. 27, 2017  Take these medicines the morning of surgery with a SIP OF WATER:  None  Stop ALL herbal medications at this time   Do NOT wear jewelry (body piercing), metal hair clips/bobby pins, make-up, or nail polish. Do NOT wear lotions, powders, or perfumes.  You may wear deodorant. Do NOT shave for 48 hours prior to surgery. Do NOT bring valuables to the hospital. Contacts, dentures, or bridgework may not be worn into surgery.  Leave suitcase in car.  After surgery it may be brought to your room.  For patients admitted to the hospital, checkout time is 11:00 AM the day of discharge.

## 2016-09-05 ENCOUNTER — Encounter (INDEPENDENT_AMBULATORY_CARE_PROVIDER_SITE_OTHER): Payer: Self-pay | Admitting: Gynecology

## 2016-09-05 ENCOUNTER — Ambulatory Visit (INDEPENDENT_AMBULATORY_CARE_PROVIDER_SITE_OTHER): Payer: BLUE CROSS/BLUE SHIELD | Admitting: Anesthesiology

## 2016-09-05 ENCOUNTER — Encounter: Payer: Self-pay | Admitting: Gynecology

## 2016-09-05 DIAGNOSIS — Z23 Encounter for immunization: Secondary | ICD-10-CM

## 2016-09-07 ENCOUNTER — Encounter: Payer: Self-pay | Admitting: Gynecology

## 2016-09-07 NOTE — Progress Notes (Signed)
Error. Surgery rescheduled

## 2016-09-12 ENCOUNTER — Encounter (HOSPITAL_COMMUNITY): Admission: RE | Payer: Self-pay | Source: Ambulatory Visit

## 2016-09-12 ENCOUNTER — Inpatient Hospital Stay (HOSPITAL_COMMUNITY): Admission: RE | Admit: 2016-09-12 | Payer: BLUE CROSS/BLUE SHIELD | Source: Ambulatory Visit | Admitting: Gynecology

## 2016-09-12 SURGERY — MYOMECTOMY, ABDOMINAL APPROACH
Anesthesia: General | Laterality: Right

## 2016-09-14 ENCOUNTER — Telehealth: Payer: Self-pay

## 2016-09-14 NOTE — Telephone Encounter (Signed)
Patient called back and wanted 11/07/16.  I went ahead and scheduled her for that date. Rosemarie Ax will call and schedule pre op appt in my office. We were able to use her [facility] prior auth # from her surgery originally scheduled for 09/12/16.

## 2016-09-14 NOTE — Telephone Encounter (Signed)
Patient called stating she was ready to reschedule her surgery. She cancelled it recently due to her finances and she feels ready to do it now.  I called her back and got voice mail and left message that January schedule open and all Tuesdays available if she wants to schedule in Jan.  I told her to call me back and let me know which date she would prefer and I will get her rescheduled.

## 2016-09-22 ENCOUNTER — Encounter: Payer: Self-pay | Admitting: Anesthesiology

## 2016-10-25 ENCOUNTER — Telehealth: Payer: Self-pay

## 2016-10-25 NOTE — Telephone Encounter (Signed)
Patient called because she had surgery scheduled 11/14/16 and I had previously checked her ins benefits and provided her with her estimated [facility] surgery prepymt amt. She tells me she has met some more of her deductible and would like for me to recheck her accumulations. I did check on that and provided her with a new prepayment amount due and added that to her pre op appt where she will be paying that amount.

## 2016-10-30 ENCOUNTER — Telehealth: Payer: Self-pay

## 2016-10-30 NOTE — Telephone Encounter (Signed)
Patient called in my voice mail apologizing and stating that she needs to cancel surgery for next week. She asked about rescheduling it to end of March or April.  I will cancel surgery. I called her back and left message and asked her to call me back so we can talk before I reschedule her for the 3rd time to be sure she is able to commit to the date we pick.

## 2016-10-30 NOTE — Telephone Encounter (Signed)
Please update our schedules

## 2016-10-31 ENCOUNTER — Ambulatory Visit: Payer: BLUE CROSS/BLUE SHIELD | Admitting: Gynecology

## 2016-10-31 ENCOUNTER — Inpatient Hospital Stay (HOSPITAL_COMMUNITY): Admission: RE | Admit: 2016-10-31 | Payer: BLUE CROSS/BLUE SHIELD | Source: Ambulatory Visit

## 2016-11-07 ENCOUNTER — Encounter (HOSPITAL_COMMUNITY): Admission: RE | Payer: Self-pay | Source: Ambulatory Visit

## 2016-11-07 ENCOUNTER — Inpatient Hospital Stay (HOSPITAL_COMMUNITY): Admission: RE | Admit: 2016-11-07 | Payer: BLUE CROSS/BLUE SHIELD | Source: Ambulatory Visit | Admitting: Gynecology

## 2016-11-07 SURGERY — MYOMECTOMY, ABDOMINAL APPROACH
Anesthesia: General | Laterality: Right

## 2017-01-04 ENCOUNTER — Telehealth: Payer: Self-pay

## 2017-01-04 NOTE — Telephone Encounter (Addendum)
I made attempts earlier this morning to call patient about scheduling surgery but was not successful with either phone number. I typed a letter to send her but she saw the missed call and called. I did not send the letter.    We discussed that she is ready to re-schedule. I reviewed her ins benefits and her estimated GGA surgery prepayment due by one week before surgery. Financial letter will me sent to her as well.  I told her she really needs to be sure she is ready to do this as this will be 3rd time we have scheduled her. She admitted she has always been nervous before and cancelled. She said she is really ready this time because she know she needs to have this done.  I will schedule her for 02/27/17. Rosemarie Ax will schedule her pre op appt.

## 2017-02-13 NOTE — Patient Instructions (Signed)
Your procedure is scheduled on:  Tuesday, Feb 27, 2017  Enter through the Main Entrance of [facility] at:  6:00 AM  Pick up the phone at the desk and dial [phone].  Call this number if you have problems the morning of surgery: [phone].  Remember: Do NOT eat food or drink after:  Midnight Monday  Take these medicines the morning of surgery with a SIP OF WATER:  None  Stop ALL herbal medications at this time  Do NOT smoke the day of surgery.  Do NOT wear jewelry (body piercing), metal hair clips/bobby pins, make-up, or nail polish. Do NOT wear lotions, powders, or perfumes.  You may wear deodorant. Do NOT shave for 48 hours prior to surgery. Do NOT bring valuables to the hospital. Contacts, dentures, or bridgework may not be worn into surgery.  Leave suitcase in car.  After surgery it may be brought to your room.  For patients admitted to the hospital, checkout time is 11:00 AM the day of discharge.  Bring a copy of your healthcare power of attorney and living will documents.

## 2017-02-14 ENCOUNTER — Inpatient Hospital Stay (HOSPITAL_COMMUNITY)
Admission: RE | Admit: 2017-02-14 | Discharge: 2017-02-14 | Disposition: A | Discharge status: Home / Self Care | Payer: BLUE CROSS/BLUE SHIELD | Source: Ambulatory Visit

## 2017-02-19 ENCOUNTER — Ambulatory Visit: Payer: BLUE CROSS/BLUE SHIELD | Admitting: Gynecology

## 2017-02-20 ENCOUNTER — Ambulatory Visit (INDEPENDENT_AMBULATORY_CARE_PROVIDER_SITE_OTHER): Payer: BLUE CROSS/BLUE SHIELD | Admitting: Gynecology

## 2017-02-20 ENCOUNTER — Encounter: Payer: Self-pay | Admitting: Gynecology

## 2017-02-20 VITALS — BP 126/78 | Ht 62.0 in | Wt 199.8 lb

## 2017-02-20 DIAGNOSIS — Z01818 Encounter for other preprocedural examination: Secondary | ICD-10-CM

## 2017-02-20 DIAGNOSIS — N926 Irregular menstruation, unspecified: Secondary | ICD-10-CM | POA: Diagnosis not present

## 2017-02-20 DIAGNOSIS — D251 Intramural leiomyoma of uterus: Secondary | ICD-10-CM | POA: Diagnosis not present

## 2017-02-20 DIAGNOSIS — D250 Submucous leiomyoma of uterus: Secondary | ICD-10-CM

## 2017-02-20 LAB — PREGNANCY, URINE: Preg Test, Ur: NEGATIVE

## 2017-02-20 MED ORDER — METOCLOPRAMIDE HCL 10 MG PO TABS: 10.0000 mg | ORAL_TABLET | Freq: Three times a day (TID) | ORAL | 1 refills | Status: DC

## 2017-02-20 MED ORDER — CLINDAMYCIN PHOSPHATE 2 % VA CREA: 1.0000 | TOPICAL_CREAM | Freq: Every day | VAGINAL | 0 refills | Status: DC

## 2017-02-20 MED ORDER — OXYCODONE-ACETAMINOPHEN 5-325 MG PO TABS: 1.0000 | ORAL_TABLET | ORAL | 0 refills | Status: DC | PRN

## 2017-02-20 NOTE — Progress Notes (Signed)
Jane Cannon is an 31 y.o. female who last year was scheduled to undergo an abdominal myomectomy as a result of her symptomatic uterus but canceled. She's now ready to move forward. Her history is as follows:  Patient was seen the office for the first time in February 2017. At that time she was reseveral years ago she had a LEEP cervical conization for CIN-3 reports normal Pap smears afterwards. Patient at that office visit was requesting to have her Mirena IUD removes and she was planning on getting pregnant.   Ultrasound June 2017 demonstrated the following: Uterus measured 9.8 59.6 x 7.3 cm with endometrial stripe is 7.1 mm. Patient was several intramural and subserosal fibroids as follows: Intramural: 7.7 x 5.1 x 6.4 cm, 4.4 x 3.3 x 3.0 cm, 2.3 x 1.4 cm, left subserosal posterior fibroid 3.4 x 3.2 x 3.5 cm with negative color flow. Right ovarian follicle cyst measuring 19 x 15 mm. Left ovarian solid mass measuring 27 x 20 x 20 mm and 19 x 13x 18 mm solid with several small cyst tiny cyst with color flow documented both. Arterial blood flow was noted also left ovary was negative otherwise no fluid in the cul-de-sac.  Patient has been complaining also recently of right lower quadrant discomfort. At the last office visit she received Depo-Provera 150 mg IM in effort to suppress the left ovarian cyst. Her CA 125 was in the normal range with a value of 6.  Ultrasound 06/28/2016:: Uterus measured 13.9 x 9.3 x 8.6 cm has increased over the course the past 3 months. Several fibroids were described as follows intramural and serosal: 3.6 x 3.7 cm, 80.7 x 5.7 cm, 3.3 x 2.8 cm, 4.7 x 4.4 cm. A thin-walled right ovarian cystic mass with reticular echo pattern was described with a measurement of 4.1 x 2.7 x 4.0 cm average size 3.6 cm. Left ovary solid mass no longer present and no fluid in the cul-de-sac.  She received her last dose of Depo-Provera September 2017 and has had slightly irregular cycle  since that time has not been using any form of contraception so urine pregnancy test was done today  which was negative. Patient is scheduled for abdominal myomectomy next week.  Pertinent Gynecological History: Menses: irreg Bleeding: irreg Contraception: none DES exposure: unknown Blood transfusions: none Sexually transmitted diseases: chlamydia Previous GYN Procedures: LEEP [facility]  Last mammogram: not indicated Date: not indicated Last pap: normal Date: 2017 OB History: G0, P0   Menstrual History: Menarche age: 37 No LMP recorded. Patient has had an injection.    Past Medical History:  Diagnosis Date  . CIN III (cervical intraepithelial neoplasia grade III) with severe dysplasia   . Fibroid uterus     Past Surgical History:  Procedure Laterality Date  . CERVICAL BIOPSY  W/ LOOP ELECTRODE EXCISION  03/04/2015    Family History  Problem Relation Age of Onset  . Diabetes Maternal Grandfather   . Diabetes Maternal Grandmother   . Diabetes Paternal Grandfather   . Diabetes Paternal Grandmother     Social History:  reports that she has been smoking Cigarettes.  She has a 8.00 pack-year smoking history. She has never used smokeless tobacco. She reports that she drinks alcohol. She reports that she uses drugs, including Marijuana.  Allergies:  Allergies  Allergen Reactions  . Sulfa Antibiotics Hives and Itching     (Not in a hospital admission)  REVIEW OF SYSTEMS: A ROS was performed and pertinent positives and negatives  are included in the history.  GENERAL: No fevers or chills. HEENT: No change in vision, no earache, sore throat or sinus congestion. NECK: No pain or stiffness. CARDIOVASCULAR: No chest pain or pressure. No palpitations. PULMONARY: No shortness of breath, cough or wheeze. GASTROINTESTINAL: No abdominal pain, nausea, vomiting or diarrhea, melena or bright red blood per rectum. GENITOURINARY: No urinary frequency, urgency, hesitancy or dysuria.  MUSCULOSKELETAL: No joint or muscle pain, no back pain, no recent trauma. DERMATOLOGIC: No rash, no itching, no lesions. ENDOCRINE: No polyuria, polydipsia, no heat or cold intolerance. No recent change in weight. HEMATOLOGICAL: No anemia or easy bruising or bleeding. NEUROLOGIC: No headache, seizures, numbness, tingling or weakness. PSYCHIATRIC: No depression, no loss of interest in normal activity or change in sleep pattern.     Blood pressure 126/78, height 5\' 2"  (1.575 m), weight 199 lb 12.8 oz (90.6 kg).  Physical Exam:  HEENT:unremarkable Neck:Supple, midline, no thyroid megaly, no carotid bruits Lungs:  Clear to auscultation no rhonchi's or wheezes Heart:Regular rate and rhythm, no murmurs or gallops Breast Exam: Symmetrical in appearance no palpable masses or tenderness no supra clavicular axillary lymphadenopathy Abdomen: Soft nontender no rebound or guarding Pelvic:BUS within normal limits Vagina: No gross lesions or discharge slight odor Cervix: No gross lesions on inspection Pap smear done today Uterus: 12-14 week size irregular Adnexa: Difficult to evaluate due to size of uterus Extremities: No cords, no edema Rectal: Not examined    Assessment/Plan: With symptomatic fibroids continue it and bloating and lower abdominal discomfort irregular cycles. Patient scheduled to undergo an abdominal myomectomy next week. Because of the slight odor in the vagina and going to place her on Cleocin vaginal cream to apply daily at bedtime until the day of her surgery. She will return to the office this week for follow-up ultrasound to compare with 9 months ago before proceeding with her surgery. The risk benefits and pros and cons of the surgery as discussed below:                       Patient was counseled as to the risk of surgery to include the following:  1. Infection (prohylactic antibiotics will be administered)  2. DVT/Pulmonary Embolism (prophylactic pneumo compression stockings  will be used)  3.Trauma to internal organs requiring additional surgical procedure to repair any injury to     Internal organs requiring perhaps additional hospitalization days.  4.Hemmorhage requiring transfusion and blood products which carry risks such as  anaphylactic reaction, hepatitis and AIDS  Patient had received literature information on the procedure scheduled and all her questions were answered and fully accepts all risk.  Prescription for Percocet 5/325 was provided to take 1 by mouth every 4-6 hours when necessary postop and Reglan 10 mg to take 1 by mouth every 6-8 hours when necessary nausea vomiting postop.   [facility] HMD2:56 PMTD     Terrance Mass 02/20/2017, 2:35 PM

## 2017-02-20 NOTE — Addendum Note (Signed)
Addended by: Dorothyann Gibbs on: 02/20/2017 03:19 PM   Modules accepted: Orders

## 2017-02-20 NOTE — Patient Instructions (Signed)
Myomectomy Myomectomy is surgery to remove a noncancerous tumor (myoma) from the uterus. Myomas are tumors made up of fibrous tissue. They are often called fibroid tumors. Fibroid tumors can range from the size of a pea to the size of a grapefruit. In a myomectomy, the fibroid tumor is removed without removing the uterus. Because these tumors are rarely cancerous, this surgery is usually done only if the tumor is growing or causing symptoms such as pain, pressure, bleeding, or pain with intercourse. LET YOUR HEALTH CARE PROVIDER KNOW ABOUT:  Any allergies you have.  All medicines you are taking, including vitamins, herbs, eye drops, creams, and over-the-counter medicines.  Previous problems you or members of your family have had with the use of anesthetics.  Any blood disorders you have.  Previous surgeries you have had.  Medical conditions you have. RISKS AND COMPLICATIONS Generally, this is a safe procedure. However, as with any procedure, complications can occur. Possible complications include:  Excessive bleeding.  Infection.  Injury to nearby organs.  Blood clots in the legs, chest, or brain.  Scar tissue on other organs and in the pelvis. This may require another surgery to remove the scar tissue.  BEFORE THE PROCEDURE  Ask your health care provider about changing or stopping your regular medicines. Avoid taking aspirin or blood thinners as directed by your health care provider.  Do not  eat or drink anything after midnight on the night before surgery.  If you smoke, do not  smoke for 2 weeks before the surgery.  Do not  drink alcohol the day before the surgery.  Arrange for someone to drive you home after the procedure or after your hospital stay. Also arrange for someone to help you with activities during your recovery. PROCEDURE You will be given medicine to make you sleep through the procedure (general anesthetic). Any of the following methods may be used to perform  a myomectomy:  Small monitors will be put on your body. They are used to check your heart, blood pressure, and oxygen level.  An IV access tube will be put into one of your veins. Medicine will be able to flow directly into your body through this IV tube.  You might be given a medicine to help you relax (sedative).  You will be given a medicine to make you sleep (general anesthetic). A breathing tube will be placed into your lungs during the procedure.  A thin, flexible tube (catheter) will be inserted into your bladder to collect urine.  Any of the following methods may be used to perform a myomectomy: ? Hysteroscopic myomectomy-This method may be used when the fibroid tumor is inside the cavity of the uterus. A long, thin tube that is like a telescope (hysteroscope) is inserted inside the uterus. A saline solution is put into your uterus. This expands the uterus and allows the surgeon to see the fibroids. Tools are passed through the hysteroscope to remove the fibroid tumor in pieces. ? Laparoscopic myomectomy-A few small cuts (incisions) are made in the lower abdomen. A thin, lighted tube with a tiny camera on the end (laparoscope) is inserted through one of the incisions. This gives the surgeon a good view of the area. The fibroid tumor is removed through the other incisions. The incisions are then closed with stitches (sutures) or staples. ? Abdominal myomectomy-This method is used when the fibroid tumor cannot be removed with a hysteroscope or laparoscope. The surgery is performed through a larger surgical incision in the abdomen. The   fibroid tumor is removed through this incision. The incision is closed with sutures or staples.  What to expect after the procedure  If you had a laparoscopic or hysteroscopic myomectomy, you may be able to go home the same day, or you may need to stay in the hospital overnight.  If you had an abdominal myomectomy, you may need to stay in the hospital for a  few days.  Your IV access tube and catheter will be removed in 1-2 days.  You may be given medicine for pain or to help you sleep.  You may be given an antibiotic medicine, if needed. This information is not intended to replace advice given to you by your health care provider. Make sure you discuss any questions you have with your health care provider. Document Released: 07/30/2007 Document Revised: 03/09/2016 Document Reviewed: 05/14/2013 Elsevier Interactive Patient Education  2017 [employer] Inc.  

## 2017-02-21 ENCOUNTER — Other Ambulatory Visit: Payer: Self-pay | Admitting: Gynecology

## 2017-02-21 ENCOUNTER — Ambulatory Visit (INDEPENDENT_AMBULATORY_CARE_PROVIDER_SITE_OTHER): Payer: BLUE CROSS/BLUE SHIELD

## 2017-02-21 ENCOUNTER — Ambulatory Visit (INDEPENDENT_AMBULATORY_CARE_PROVIDER_SITE_OTHER): Payer: BLUE CROSS/BLUE SHIELD | Admitting: Gynecology

## 2017-02-21 ENCOUNTER — Encounter: Payer: Self-pay | Admitting: Gynecology

## 2017-02-21 DIAGNOSIS — N852 Hypertrophy of uterus: Secondary | ICD-10-CM

## 2017-02-21 DIAGNOSIS — Z0289 Encounter for other administrative examinations: Secondary | ICD-10-CM

## 2017-02-21 DIAGNOSIS — D252 Subserosal leiomyoma of uterus: Secondary | ICD-10-CM

## 2017-02-21 DIAGNOSIS — N926 Irregular menstruation, unspecified: Secondary | ICD-10-CM | POA: Diagnosis not present

## 2017-02-21 DIAGNOSIS — D251 Intramural leiomyoma of uterus: Secondary | ICD-10-CM | POA: Diagnosis not present

## 2017-02-21 DIAGNOSIS — D250 Submucous leiomyoma of uterus: Secondary | ICD-10-CM

## 2017-02-21 DIAGNOSIS — N83201 Unspecified ovarian cyst, right side: Secondary | ICD-10-CM | POA: Diagnosis not present

## 2017-02-21 NOTE — Progress Notes (Signed)
   Patient is a 31 year old was seen in the office this week for her preop examination since she is scheduled for abdominal myomectomy next week. She had not had an ultrasound since 2017 and last year she was scheduled to undergo abdominal myomectomy but had cancel her surgery due to other commitments. Her ultrasound history of fibroids is as follows:  Ultrasound June 2017 demonstrated the following: Uterus measured 9.8 59.6 x 7.3 cm with endometrial stripe is 7.1 mm. Patient was several intramural and subserosal fibroids as follows: Intramural: 7.7 x 5.1 x 6.4 cm, 4.4 x 3.3 x 3.0 cm, 2.3 x 1.4 cm, left subserosal posterior fibroid 3.4 x 3.2 x 3.5 cm with negative color flow. Right ovarian follicle cyst measuring 19 x 15 mm. Left ovarian solid mass measuring 27 x 20 x 20 mm and 19 x 13x 18 mm solid with several small cyst tiny cyst with color flow documented both. Arterial blood flow was noted also left ovary was negative otherwise no fluid in the cul-de-sac.  Patient has been complaining also recently of right lower quadrant discomfort. At the last office visit she received Depo-Provera 150 mg IM in effort to suppress the left ovarian cyst. Her CA 125 was in the normal range with a value of 6.  Ultrasound 06/28/2016:: Uterus measured 13.9 x 9.3 x 8.6 cm has increased over the course the past 3 months. Several fibroids were described as follows intramural and serosal: 3.6 x 3.7 cm, 80.7 x 5.7 cm, 3.3 x 2.8 cm, 4.7 x 4.4 cm. A thin-walled right ovarian cystic mass with reticular echo pattern was described with a measurement of 4.1 x 2.7 x 4.0 cm average size 3.6 cm. Left ovary solid mass no longer present and no fluid in the cul-de-sac.  She received her last dose of Depo-Provera September 2017 and has had slightly irregular cycle since that time has not been using any form of contraception so urine pregnancy test was done today  which was negative   Ultrasound today: Uterus measured 14.0 x 10.1 x  8.2 cm subserous and intramural fibroids with the following dimensions: 6.1 x 7.9 x 6.9 cm, 4.1 x 3.6 and meters, 4.6 x 4.5 cm. Unable to identify the endometrial cavity in the fibroid encroaching. Right and left ovary were normal. There was no fluid in the cul-de-sac.  Assessment/plan: 31 year old patient with symptomatic fibroids planning on getting pregnant later this year. Patient scheduled for abdominal myomectomy. Enlarging fibroids. Patient was seen this week for preoperative exam and consultation risk benefits and pros and cons were discussed. She was started Cleocin vaginal cream to apply twice a day until a day prior to her surgery. She was instructed not to smoke 3-4 days before surgery and stop any aspirin-containing products or nonsteroidals.  Greater than 90% time was spent counseling coronary care for this patient. Time of consultation 10 minutes

## 2017-02-22 ENCOUNTER — Other Ambulatory Visit: Payer: Self-pay | Admitting: Gynecology

## 2017-02-22 LAB — PAP IG W/ RFLX HPV ASCU

## 2017-02-22 NOTE — Patient Instructions (Signed)
Your procedure is scheduled on:  Tuesday, Feb 27, 2017  Enter through the Main Entrance of [facility] at:  6:00 AM  Pick up the phone at the desk and dial [phone].  Call this number if you have problems the morning of surgery: [phone].  Remember: Do NOT eat food or drink after:  Midnight Monday  Take these medicines the morning of surgery with a SIP OF WATER:  None  Stop ALL herbal medications at this time  Do NOT smoke the day of surgery.  Do NOT wear jewelry (body piercing), metal hair clips/bobby pins, make-up, or nail polish. Do NOT wear lotions, powders, or perfumes.  You may wear deodorant. Do NOT shave for 48 hours prior to surgery. Do NOT bring valuables to the hospital. Contacts, dentures, or bridgework may not be worn into surgery.  Leave suitcase in car.  After surgery it may be brought to your room.  For patients admitted to the hospital, checkout time is 11:00 AM the day of discharge.  Bring a copy of your healthcare power of attorney and living will documents.

## 2017-02-23 ENCOUNTER — Encounter (HOSPITAL_COMMUNITY): Payer: Self-pay

## 2017-02-23 ENCOUNTER — Other Ambulatory Visit (HOSPITAL_COMMUNITY): Payer: BLUE CROSS/BLUE SHIELD

## 2017-02-23 ENCOUNTER — Encounter (HOSPITAL_COMMUNITY)
Admission: RE | Admit: 2017-02-23 | Discharge: 2017-02-23 | Disposition: A | Discharge status: Home / Self Care | Payer: BLUE CROSS/BLUE SHIELD | Source: Ambulatory Visit | Attending: Gynecology | Admitting: Gynecology

## 2017-02-23 DIAGNOSIS — D259 Leiomyoma of uterus, unspecified: Secondary | ICD-10-CM | POA: Diagnosis not present

## 2017-02-23 DIAGNOSIS — Z0183 Encounter for blood typing: Secondary | ICD-10-CM | POA: Diagnosis not present

## 2017-02-23 DIAGNOSIS — Z01812 Encounter for preprocedural laboratory examination: Secondary | ICD-10-CM | POA: Insufficient documentation

## 2017-02-23 LAB — CBC
HEMATOCRIT: 40.9 % (ref 36.0–46.0)
Hemoglobin: 13.9 g/dL (ref 12.0–15.0)
MCH: 30 pg (ref 26.0–34.0)
MCHC: 34 g/dL (ref 30.0–36.0)
MCV: 88.1 fL (ref 78.0–100.0)
PLATELETS: 234 10*3/uL (ref 150–400)
RBC: 4.64 MIL/uL (ref 3.87–5.11)
RDW: 13.9 % (ref 11.5–15.5)
WBC: 7.4 10*3/uL (ref 4.0–10.5)

## 2017-02-23 LAB — TYPE AND SCREEN
ABO/RH(D): O POS
ANTIBODY SCREEN: NEGATIVE

## 2017-02-27 ENCOUNTER — Inpatient Hospital Stay (HOSPITAL_COMMUNITY): Payer: BLUE CROSS/BLUE SHIELD | Admitting: Anesthesiology

## 2017-02-27 ENCOUNTER — Observation Stay (HOSPITAL_COMMUNITY)
Admission: RE | Admit: 2017-02-27 | Discharge: 2017-02-28 | Disposition: A | Discharge status: Home / Self Care | Payer: BLUE CROSS/BLUE SHIELD | Source: Ambulatory Visit | Attending: Gynecology | Admitting: Gynecology

## 2017-02-27 ENCOUNTER — Encounter (HOSPITAL_COMMUNITY)
Admission: RE | Disposition: A | Discharge status: Home / Self Care | Payer: Self-pay | Source: Ambulatory Visit | Attending: Gynecology

## 2017-02-27 ENCOUNTER — Encounter (HOSPITAL_COMMUNITY): Payer: Self-pay

## 2017-02-27 DIAGNOSIS — N8311 Corpus luteum cyst of right ovary: Secondary | ICD-10-CM | POA: Diagnosis not present

## 2017-02-27 DIAGNOSIS — E669 Obesity, unspecified: Secondary | ICD-10-CM | POA: Insufficient documentation

## 2017-02-27 DIAGNOSIS — D251 Intramural leiomyoma of uterus: Secondary | ICD-10-CM | POA: Diagnosis not present

## 2017-02-27 DIAGNOSIS — Z6836 Body mass index (BMI) 36.0-36.9, adult: Secondary | ICD-10-CM | POA: Diagnosis not present

## 2017-02-27 DIAGNOSIS — Z833 Family history of diabetes mellitus: Secondary | ICD-10-CM | POA: Insufficient documentation

## 2017-02-27 DIAGNOSIS — Z9889 Other specified postprocedural states: Secondary | ICD-10-CM | POA: Diagnosis not present

## 2017-02-27 DIAGNOSIS — N736 Female pelvic peritoneal adhesions (postinfective): Secondary | ICD-10-CM | POA: Insufficient documentation

## 2017-02-27 DIAGNOSIS — D252 Subserosal leiomyoma of uterus: Secondary | ICD-10-CM | POA: Insufficient documentation

## 2017-02-27 DIAGNOSIS — Z882 Allergy status to sulfonamides status: Secondary | ICD-10-CM | POA: Diagnosis not present

## 2017-02-27 DIAGNOSIS — N83201 Unspecified ovarian cyst, right side: Secondary | ICD-10-CM | POA: Diagnosis present

## 2017-02-27 DIAGNOSIS — F17210 Nicotine dependence, cigarettes, uncomplicated: Secondary | ICD-10-CM | POA: Insufficient documentation

## 2017-02-27 DIAGNOSIS — D259 Leiomyoma of uterus, unspecified: Secondary | ICD-10-CM | POA: Diagnosis not present

## 2017-02-27 HISTORY — PX: CHROMOPERTUBATION: SHX6288

## 2017-02-27 HISTORY — PX: MYOMECTOMY: SHX85

## 2017-02-27 HISTORY — PX: OVARIAN CYST REMOVAL: SHX89

## 2017-02-27 LAB — PREGNANCY, URINE: Preg Test, Ur: NEGATIVE

## 2017-02-27 SURGERY — MYOMECTOMY, ABDOMINAL APPROACH
Anesthesia: General | Site: Vagina | Laterality: Right

## 2017-02-27 MED ORDER — GABAPENTIN 300 MG PO CAPS
300.0000 mg | ORAL_CAPSULE | Freq: Once | ORAL | Status: AC
  Administered 2017-02-27: 300 mg via ORAL

## 2017-02-27 MED ORDER — ACETAMINOPHEN 500 MG PO TABS
ORAL_TABLET | ORAL | Status: AC
  Filled 2017-02-27: qty 2

## 2017-02-27 MED ORDER — HYDROMORPHONE HCL 1 MG/ML IJ SOLN
INTRAMUSCULAR | Status: DC | PRN
  Administered 2017-02-27 (×2): 0.5 mg via INTRAVENOUS

## 2017-02-27 MED ORDER — KETOROLAC TROMETHAMINE 30 MG/ML IJ SOLN
INTRAMUSCULAR | Status: AC
Start: 2017-02-27 — End: 2017-02-27
  Filled 2017-02-27: qty 1

## 2017-02-27 MED ORDER — BUPIVACAINE LIPOSOME 1.3 % IJ SUSP
INTRAMUSCULAR | Status: DC | PRN
  Administered 2017-02-27: 20 mL

## 2017-02-27 MED ORDER — KETOROLAC TROMETHAMINE 30 MG/ML IJ SOLN
30.0000 mg | Freq: Once | INTRAMUSCULAR | Status: AC
  Administered 2017-02-27: 30 mg via INTRAVENOUS

## 2017-02-27 MED ORDER — SODIUM CHLORIDE 0.9 % IJ SOLN
INTRAMUSCULAR | Status: AC
  Filled 2017-02-27: qty 50

## 2017-02-27 MED ORDER — MIDAZOLAM HCL 2 MG/2ML IJ SOLN
INTRAMUSCULAR | Status: AC
  Filled 2017-02-27: qty 2

## 2017-02-27 MED ORDER — BUPIVACAINE HCL (PF) 0.25 % IJ SOLN
INTRAMUSCULAR | Status: DC | PRN
  Administered 2017-02-27: 20 mL

## 2017-02-27 MED ORDER — VASOPRESSIN 20 UNIT/ML IV SOLN
INTRAVENOUS | Status: AC
  Filled 2017-02-27: qty 1

## 2017-02-27 MED ORDER — GABAPENTIN 300 MG PO CAPS
ORAL_CAPSULE | ORAL | Status: AC
  Filled 2017-02-27: qty 1

## 2017-02-27 MED ORDER — FENTANYL CITRATE (PF) 100 MCG/2ML IJ SOLN
INTRAMUSCULAR | Status: AC
  Filled 2017-02-27: qty 2

## 2017-02-27 MED ORDER — FENTANYL CITRATE (PF) 250 MCG/5ML IJ SOLN
INTRAMUSCULAR | Status: AC
  Filled 2017-02-27: qty 5

## 2017-02-27 MED ORDER — LACTATED RINGERS IV SOLN
INTRAVENOUS | Status: DC
  Administered 2017-02-27 – 2017-02-28 (×3): via INTRAVENOUS

## 2017-02-27 MED ORDER — SCOPOLAMINE 1 MG/3DAYS TD PT72
MEDICATED_PATCH | TRANSDERMAL | Status: AC
  Administered 2017-02-27: 1.5 mg via TRANSDERMAL
  Filled 2017-02-27: qty 1

## 2017-02-27 MED ORDER — OXYCODONE-ACETAMINOPHEN 5-325 MG PO TABS
1.0000 | ORAL_TABLET | ORAL | Status: DC | PRN
  Administered 2017-02-28 (×3): 1 via ORAL
  Filled 2017-02-27 (×3): qty 1

## 2017-02-27 MED ORDER — LACTATED RINGERS IV SOLN
INTRAVENOUS | Status: DC
  Administered 2017-02-27 (×3): via INTRAVENOUS
  Administered 2017-02-27: 1000 mL via INTRAVENOUS

## 2017-02-27 MED ORDER — DIPHENHYDRAMINE HCL 12.5 MG/5ML PO ELIX: 12.5000 mg | ORAL_SOLUTION | Freq: Four times a day (QID) | ORAL | Status: DC | PRN

## 2017-02-27 MED ORDER — LACTATED RINGERS IV SOLN: INTRAVENOUS | Status: DC | PRN

## 2017-02-27 MED ORDER — SODIUM CHLORIDE 0.9 % IV SOLN: 20.0000 mL | Freq: Once | INTRAVENOUS | Status: DC

## 2017-02-27 MED ORDER — SUGAMMADEX SODIUM 200 MG/2ML IV SOLN
INTRAVENOUS | Status: DC | PRN
  Administered 2017-02-27: 200 mg via INTRAVENOUS

## 2017-02-27 MED ORDER — HYDROMORPHONE HCL 1 MG/ML IJ SOLN
0.5000 mg | INTRAMUSCULAR | Status: AC | PRN
  Administered 2017-02-27 (×2): 0.5 mg via INTRAVENOUS

## 2017-02-27 MED ORDER — BUPIVACAINE HCL (PF) 0.25 % IJ SOLN
INTRAMUSCULAR | Status: AC
  Filled 2017-02-27: qty 30

## 2017-02-27 MED ORDER — HYDROMORPHONE HCL 1 MG/ML IJ SOLN
INTRAMUSCULAR | Status: AC
  Filled 2017-02-27: qty 1

## 2017-02-27 MED ORDER — BUPIVACAINE LIPOSOME 1.3 % IJ SUSP
20.0000 mL | Freq: Once | INTRAMUSCULAR | Status: DC
  Filled 2017-02-27: qty 20

## 2017-02-27 MED ORDER — MIDAZOLAM HCL 5 MG/5ML IJ SOLN
INTRAMUSCULAR | Status: DC | PRN
  Administered 2017-02-27: 2 mg via INTRAVENOUS

## 2017-02-27 MED ORDER — ROCURONIUM BROMIDE 100 MG/10ML IV SOLN
INTRAVENOUS | Status: DC | PRN
  Administered 2017-02-27: 40 mg via INTRAVENOUS
  Administered 2017-02-27 (×4): 10 mg via INTRAVENOUS

## 2017-02-27 MED ORDER — PROMETHAZINE HCL 25 MG/ML IJ SOLN: 6.2500 mg | INTRAMUSCULAR | Status: DC | PRN

## 2017-02-27 MED ORDER — MORPHINE SULFATE 2 MG/ML IV SOLN
INTRAVENOUS | Status: DC
  Administered 2017-02-27: 15:00:00 via INTRAVENOUS
  Administered 2017-02-27: 4.5 mL via INTRAVENOUS
  Administered 2017-02-27: 3 mg via INTRAVENOUS
  Administered 2017-02-28: 4 mg via INTRAVENOUS
  Administered 2017-02-28: 5 mg via INTRAVENOUS

## 2017-02-27 MED ORDER — LIDOCAINE HCL (CARDIAC) 20 MG/ML IV SOLN
INTRAVENOUS | Status: AC
  Filled 2017-02-27: qty 5

## 2017-02-27 MED ORDER — METOCLOPRAMIDE HCL 10 MG PO TABS
10.0000 mg | ORAL_TABLET | Freq: Three times a day (TID) | ORAL | Status: DC
  Administered 2017-02-27 – 2017-02-28 (×4): 10 mg via ORAL
  Filled 2017-02-27 (×4): qty 1

## 2017-02-27 MED ORDER — FENTANYL CITRATE (PF) 250 MCG/5ML IJ SOLN
INTRAMUSCULAR | Status: DC | PRN
  Administered 2017-02-27: 100 ug via INTRAVENOUS
  Administered 2017-02-27: 50 ug via INTRAVENOUS
  Administered 2017-02-27: 150 ug via INTRAVENOUS
  Administered 2017-02-27 (×2): 100 ug via INTRAVENOUS

## 2017-02-27 MED ORDER — ONDANSETRON HCL 4 MG/2ML IJ SOLN
INTRAMUSCULAR | Status: AC
  Filled 2017-02-27: qty 2

## 2017-02-27 MED ORDER — ONDANSETRON HCL 4 MG/2ML IJ SOLN: 4.0000 mg | Freq: Four times a day (QID) | INTRAMUSCULAR | Status: DC | PRN

## 2017-02-27 MED ORDER — ACETAMINOPHEN 500 MG PO TABS
1000.0000 mg | ORAL_TABLET | Freq: Once | ORAL | Status: AC
Start: 2017-02-27 — End: 2017-02-27
  Administered 2017-02-27: 1000 mg via ORAL

## 2017-02-27 MED ORDER — SCOPOLAMINE 1 MG/3DAYS TD PT72
1.0000 | MEDICATED_PATCH | Freq: Once | TRANSDERMAL | Status: DC
  Administered 2017-02-27: 1.5 mg via TRANSDERMAL

## 2017-02-27 MED ORDER — NALOXONE HCL 0.4 MG/ML IJ SOLN: 0.4000 mg | INTRAMUSCULAR | Status: DC | PRN

## 2017-02-27 MED ORDER — DEXAMETHASONE SODIUM PHOSPHATE 10 MG/ML IJ SOLN
INTRAMUSCULAR | Status: AC
  Filled 2017-02-27: qty 1

## 2017-02-27 MED ORDER — CEFOTETAN DISODIUM-DEXTROSE 2-2.08 GM-% IV SOLR
INTRAVENOUS | Status: AC
  Filled 2017-02-27: qty 50

## 2017-02-27 MED ORDER — CEFOTETAN DISODIUM-DEXTROSE 2-2.08 GM-% IV SOLR
2.0000 g | INTRAVENOUS | Status: AC
  Administered 2017-02-27: 2 g via INTRAVENOUS

## 2017-02-27 MED ORDER — SODIUM CHLORIDE 0.9% FLUSH: 9.0000 mL | INTRAVENOUS | Status: DC | PRN

## 2017-02-27 MED ORDER — VASOPRESSIN 20 UNIT/ML IV SOLN
INTRAVENOUS | Status: DC | PRN
  Administered 2017-02-27: 20 mL via INTRAMUSCULAR

## 2017-02-27 MED ORDER — DIPHENHYDRAMINE HCL 50 MG/ML IJ SOLN: 12.5000 mg | Freq: Four times a day (QID) | INTRAMUSCULAR | Status: DC | PRN

## 2017-02-27 MED ORDER — LIDOCAINE HCL (CARDIAC) 20 MG/ML IV SOLN
INTRAVENOUS | Status: DC | PRN
  Administered 2017-02-27: 60 mg via INTRAVENOUS

## 2017-02-27 MED ORDER — METHYLENE BLUE 0.5 % INJ SOLN
INTRAVENOUS | Status: AC
Start: 2017-02-27 — End: 2017-02-27
  Filled 2017-02-27: qty 10

## 2017-02-27 MED ORDER — SUGAMMADEX SODIUM 200 MG/2ML IV SOLN
INTRAVENOUS | Status: AC
  Filled 2017-02-27: qty 2

## 2017-02-27 MED ORDER — DEXAMETHASONE SODIUM PHOSPHATE 10 MG/ML IJ SOLN
INTRAMUSCULAR | Status: DC | PRN
  Administered 2017-02-27: 10 mg via INTRAVENOUS

## 2017-02-27 MED ORDER — FENTANYL CITRATE (PF) 100 MCG/2ML IJ SOLN
25.0000 ug | INTRAMUSCULAR | Status: DC | PRN
  Administered 2017-02-27 (×3): 50 ug via INTRAVENOUS

## 2017-02-27 MED ORDER — PROPOFOL 10 MG/ML IV BOLUS
INTRAVENOUS | Status: DC | PRN
  Administered 2017-02-27: 200 mg via INTRAVENOUS

## 2017-02-27 MED ORDER — GLYCOPYRROLATE 0.2 MG/ML IJ SOLN
INTRAMUSCULAR | Status: AC
  Filled 2017-02-27: qty 4

## 2017-02-27 MED ORDER — NEOSTIGMINE METHYLSULFATE 10 MG/10ML IV SOLN
INTRAVENOUS | Status: AC
  Filled 2017-02-27: qty 1

## 2017-02-27 MED ORDER — SODIUM CHLORIDE 0.9 % IJ SOLN
INTRAMUSCULAR | Status: DC | PRN
  Administered 2017-02-27: 50 mL via INTRAVENOUS

## 2017-02-27 MED ORDER — ONDANSETRON HCL 4 MG/2ML IJ SOLN
INTRAMUSCULAR | Status: DC | PRN
  Administered 2017-02-27: 4 mg via INTRAVENOUS

## 2017-02-27 MED ORDER — PROPOFOL 10 MG/ML IV BOLUS
INTRAVENOUS | Status: AC
  Filled 2017-02-27: qty 20

## 2017-02-27 MED ORDER — METHYLENE BLUE 0.5 % INJ SOLN
INTRAVENOUS | Status: DC | PRN
  Administered 2017-02-27: 1 mL via SUBMUCOSAL

## 2017-02-27 SURGICAL SUPPLY — 61 items
ADAPTER CATH SYR TO TUBING 38M (ADAPTER) ×5 IMPLANT
BARRIER ADHS 3X4 INTERCEED (GAUZE/BANDAGES/DRESSINGS) IMPLANT
BENZOIN TINCTURE PRP APPL 2/3 (GAUZE/BANDAGES/DRESSINGS) ×5 IMPLANT
CANISTER SUCT 3000ML PPV (MISCELLANEOUS) ×5 IMPLANT
CATH FOLEY 2WAY  3CC  8FR (CATHETERS) ×2
CATH FOLEY 2WAY 3CC 8FR (CATHETERS) ×3 IMPLANT
CELLS DAT CNTRL 66122 CELL SVR (MISCELLANEOUS) IMPLANT
CLOSURE WOUND 1/2 X4 (GAUZE/BANDAGES/DRESSINGS) ×1
CLOTH BEACON ORANGE TIMEOUT ST (SAFETY) ×5 IMPLANT
CONT PATH 16OZ SNAP LID 3702 (MISCELLANEOUS) ×5 IMPLANT
DECANTER SPIKE VIAL GLASS SM (MISCELLANEOUS) IMPLANT
DRAPE CESAREAN BIRTH W POUCH (DRAPES) ×5 IMPLANT
DRAPE SURG 17X11 SM STRL (DRAPES) ×5 IMPLANT
DRAPE WARM FLUID 44X44 (DRAPE) ×5 IMPLANT
DRSG OPSITE POSTOP 4X10 (GAUZE/BANDAGES/DRESSINGS) ×5 IMPLANT
ELECT BLADE 6.5 EXT (BLADE) IMPLANT
ELECT NEEDLE TIP 2.8 STRL (NEEDLE) ×5 IMPLANT
GAUZE SPONGE 4X4 16PLY XRAY LF (GAUZE/BANDAGES/DRESSINGS) IMPLANT
GLOVE BIOGEL PI IND STRL 7.0 (GLOVE) ×3 IMPLANT
GLOVE BIOGEL PI IND STRL 8 (GLOVE) ×3 IMPLANT
GLOVE BIOGEL PI INDICATOR 7.0 (GLOVE) ×2
GLOVE BIOGEL PI INDICATOR 8 (GLOVE) ×2
GLOVE ECLIPSE 7.5 STRL STRAW (GLOVE) ×10 IMPLANT
GLOVE NEODERM STER SZ 7 (GLOVE) ×5 IMPLANT
GLOVE SURG SS PI 7.0 STRL IVOR (GLOVE) ×15 IMPLANT
GOWN STRL NON-REIN LRG LVL3 (GOWN DISPOSABLE) ×5 IMPLANT
GOWN STRL REUS W/TWL LRG LVL3 (GOWN DISPOSABLE) ×10 IMPLANT
IV STOPCOCK 4 WAY 40  W/Y SET (IV SOLUTION) ×4
IV STOPCOCK 4 WAY 40 W/Y SET (IV SOLUTION) ×6 IMPLANT
MANIPULATOR UTERINE 4.5 ZUMI (MISCELLANEOUS) IMPLANT
NEEDLE HYPO 22GX1.5 SAFETY (NEEDLE) ×10 IMPLANT
NS IRRIG 1000ML POUR BTL (IV SOLUTION) ×10 IMPLANT
PACK ABDOMINAL GYN (CUSTOM PROCEDURE TRAY) ×5 IMPLANT
PAD OB MATERNITY 4.3X12.25 (PERSONAL CARE ITEMS) ×5 IMPLANT
PROTECTOR NERVE ULNAR (MISCELLANEOUS) ×5 IMPLANT
RETAINER VISCERAL (MISCELLANEOUS) ×5 IMPLANT
RETRACTOR WND ALEXIS 25 LRG (MISCELLANEOUS) IMPLANT
RTRCTR WOUND ALEXIS 18CM MED (MISCELLANEOUS)
RTRCTR WOUND ALEXIS 25CM LRG (MISCELLANEOUS)
SPONGE GAUZE 4X4 12PLY STER LF (GAUZE/BANDAGES/DRESSINGS) ×5 IMPLANT
STAPLER VISISTAT 35W (STAPLE) IMPLANT
STRIP CLOSURE SKIN 1/2X4 (GAUZE/BANDAGES/DRESSINGS) ×4 IMPLANT
SUT PLAIN 3 0 CT 1 27 (SUTURE) ×5 IMPLANT
SUT VIC AB 0 CT1 18XCR BRD8 (SUTURE) ×6 IMPLANT
SUT VIC AB 0 CT1 27 (SUTURE) ×4
SUT VIC AB 0 CT1 27XBRD ANBCTR (SUTURE) ×6 IMPLANT
SUT VIC AB 0 CT1 8-18 (SUTURE) ×4
SUT VIC AB 1 CT1 36 (SUTURE) ×10 IMPLANT
SUT VIC AB 2-0 CT1 27 (SUTURE) ×4
SUT VIC AB 2-0 CT1 TAPERPNT 27 (SUTURE) ×6 IMPLANT
SUT VIC AB 3-0 CT1 27 (SUTURE) ×14
SUT VIC AB 3-0 CT1 TAPERPNT 27 (SUTURE) ×21 IMPLANT
SUT VIC AB 3-0 SH 27 (SUTURE)
SUT VIC AB 3-0 SH 27X BRD (SUTURE) IMPLANT
SUT VIC AB 4-0 KS 27 (SUTURE) IMPLANT
SUT VIC AB 4-0 PS2 27 (SUTURE) IMPLANT
SUT VICRYL 0 TIES 12 18 (SUTURE) ×5 IMPLANT
SYR 50ML LL SCALE MARK (SYRINGE) ×5 IMPLANT
SYR CONTROL 10ML LL (SYRINGE) ×10 IMPLANT
TOWEL OR 17X24 6PK STRL BLUE (TOWEL DISPOSABLE) ×10 IMPLANT
TRAY FOLEY CATH SILVER 14FR (SET/KITS/TRAYS/PACK) ×10 IMPLANT

## 2017-02-27 NOTE — Interval H&P Note (Signed)
History and Physical Interval Note:  02/27/2017 7:04 AM  Jane Cannon  has presented today for surgery, with the diagnosis of fibroids, right ovarian cystectomy  The various methods of treatment have been discussed with the patient and family. After consideration of risks, benefits and other options for treatment, the patient has consented to  Procedure(s) with comments: MYOMECTOMY (N/A) - requests 7:30am OR time  Requests two hours OR time  Needs Pitessin/chromopertubation OVARIAN CYSTECTOMY (Right) CHROMOPERTUBATION (Bilateral) as a surgical intervention .  The patient's history has been reviewed, patient examined, no change in status, stable for surgery.  I have reviewed the patient's chart and labs.  Questions were answered to the patient's satisfaction.     Terrance Mass

## 2017-02-27 NOTE — H&P (View-Only) (Signed)
Jane Cannon is an 31 y.o. female who last year was scheduled to undergo an abdominal myomectomy as a result of her symptomatic uterus but canceled. She's now ready to move forward. Her history is as follows:  Patient was seen the office for the first time in February 2017. At that time she was reseveral years ago she had a LEEP cervical conization for CIN-3 reports normal Pap smears afterwards. Patient at that office visit was requesting to have her Mirena IUD removes and she was planning on getting pregnant.   Ultrasound June 2017 demonstrated the following: Uterus measured 9.8 59.6 x 7.3 cm with endometrial stripe is 7.1 mm. Patient was several intramural and subserosal fibroids as follows: Intramural: 7.7 x 5.1 x 6.4 cm, 4.4 x 3.3 x 3.0 cm, 2.3 x 1.4 cm, left subserosal posterior fibroid 3.4 x 3.2 x 3.5 cm with negative color flow. Right ovarian follicle cyst measuring 19 x 15 mm. Left ovarian solid mass measuring 27 x 20 x 20 mm and 19 x 13x 18 mm solid with several small cyst tiny cyst with color flow documented both. Arterial blood flow was noted also left ovary was negative otherwise no fluid in the cul-de-sac.  Patient has been complaining also recently of right lower quadrant discomfort. At the last office visit she received Depo-Provera 150 mg IM in effort to suppress the left ovarian cyst. Her CA 125 was in the normal range with a value of 6.  Ultrasound 06/28/2016:: Uterus measured 13.9 x 9.3 x 8.6 cm has increased over the course the past 3 months. Several fibroids were described as follows intramural and serosal: 3.6 x 3.7 cm, 80.7 x 5.7 cm, 3.3 x 2.8 cm, 4.7 x 4.4 cm. A thin-walled right ovarian cystic mass with reticular echo pattern was described with a measurement of 4.1 x 2.7 x 4.0 cm average size 3.6 cm. Left ovary solid mass no longer present and no fluid in the cul-de-sac.  She received her last dose of Depo-Provera September 2017 and has had slightly irregular cycle  since that time has not been using any form of contraception so urine pregnancy test was done today  which was negative. Patient is scheduled for abdominal myomectomy next week.  Pertinent Gynecological History: Menses: irreg Bleeding: irreg Contraception: none DES exposure: unknown Blood transfusions: none Sexually transmitted diseases: chlamydia Previous GYN Procedures: LEEP [facility]  Last mammogram: not indicated Date: not indicated Last pap: normal Date: 2017 OB History: G0, P0   Menstrual History: Menarche age: 44 No LMP recorded. Patient has had an injection.    Past Medical History:  Diagnosis Date  . CIN III (cervical intraepithelial neoplasia grade III) with severe dysplasia   . Fibroid uterus     Past Surgical History:  Procedure Laterality Date  . CERVICAL BIOPSY  W/ LOOP ELECTRODE EXCISION  03/04/2015    Family History  Problem Relation Age of Onset  . Diabetes Maternal Grandfather   . Diabetes Maternal Grandmother   . Diabetes Paternal Grandfather   . Diabetes Paternal Grandmother     Social History:  reports that she has been smoking Cigarettes.  She has a 8.00 pack-year smoking history. She has never used smokeless tobacco. She reports that she drinks alcohol. She reports that she uses drugs, including Marijuana.  Allergies:  Allergies  Allergen Reactions  . Sulfa Antibiotics Hives and Itching     (Not in a hospital admission)  REVIEW OF SYSTEMS: A ROS was performed and pertinent positives and negatives  are included in the history.  GENERAL: No fevers or chills. HEENT: No change in vision, no earache, sore throat or sinus congestion. NECK: No pain or stiffness. CARDIOVASCULAR: No chest pain or pressure. No palpitations. PULMONARY: No shortness of breath, cough or wheeze. GASTROINTESTINAL: No abdominal pain, nausea, vomiting or diarrhea, melena or bright red blood per rectum. GENITOURINARY: No urinary frequency, urgency, hesitancy or dysuria.  MUSCULOSKELETAL: No joint or muscle pain, no back pain, no recent trauma. DERMATOLOGIC: No rash, no itching, no lesions. ENDOCRINE: No polyuria, polydipsia, no heat or cold intolerance. No recent change in weight. HEMATOLOGICAL: No anemia or easy bruising or bleeding. NEUROLOGIC: No headache, seizures, numbness, tingling or weakness. PSYCHIATRIC: No depression, no loss of interest in normal activity or change in sleep pattern.     Blood pressure 126/78, height 5\' 2"  (1.575 m), weight 199 lb 12.8 oz (90.6 kg).  Physical Exam:  HEENT:unremarkable Neck:Supple, midline, no thyroid megaly, no carotid bruits Lungs:  Clear to auscultation no rhonchi's or wheezes Heart:Regular rate and rhythm, no murmurs or gallops Breast Exam: Symmetrical in appearance no palpable masses or tenderness no supra clavicular axillary lymphadenopathy Abdomen: Soft nontender no rebound or guarding Pelvic:BUS within normal limits Vagina: No gross lesions or discharge slight odor Cervix: No gross lesions on inspection Pap smear done today Uterus: 12-14 week size irregular Adnexa: Difficult to evaluate due to size of uterus Extremities: No cords, no edema Rectal: Not examined    Assessment/Plan: With symptomatic fibroids continue it and bloating and lower abdominal discomfort irregular cycles. Patient scheduled to undergo an abdominal myomectomy next week. Because of the slight odor in the vagina and going to place her on Cleocin vaginal cream to apply daily at bedtime until the day of her surgery. She will return to the office this week for follow-up ultrasound to compare with 9 months ago before proceeding with her surgery. The risk benefits and pros and cons of the surgery as discussed below:                       Patient was counseled as to the risk of surgery to include the following:  1. Infection (prohylactic antibiotics will be administered)  2. DVT/Pulmonary Embolism (prophylactic pneumo compression stockings  will be used)  3.Trauma to internal organs requiring additional surgical procedure to repair any injury to     Internal organs requiring perhaps additional hospitalization days.  4.Hemmorhage requiring transfusion and blood products which carry risks such as  anaphylactic reaction, hepatitis and AIDS  Patient had received literature information on the procedure scheduled and all her questions were answered and fully accepts all risk.  Prescription for Percocet 5/325 was provided to take 1 by mouth every 4-6 hours when necessary postop and Reglan 10 mg to take 1 by mouth every 6-8 hours when necessary nausea vomiting postop.   [facility] HMD2:56 PMTD     Terrance Mass 02/20/2017, 2:35 PM

## 2017-02-27 NOTE — Anesthesia Postprocedure Evaluation (Signed)
Anesthesia Post Note  Patient: Jane Cannon  Procedure(s) Performed: Procedure(s) (LRB): MYOMECTOMY (N/A) OVARIAN CYSTECTOMY (Right) CHROMOPERTUBATION (Bilateral)  Patient location during evaluation: PACU Anesthesia Type: General Level of consciousness: awake and alert Pain management: pain level controlled Vital Signs Assessment: post-procedure vital signs reviewed and stable Respiratory status: spontaneous breathing, nonlabored ventilation, respiratory function stable and patient connected to nasal cannula oxygen Cardiovascular status: blood pressure returned to baseline and stable Postop Assessment: no signs of nausea or vomiting Anesthetic complications: no       Last Vitals:  Vitals:   02/27/17 1754 02/27/17 1820  BP:  123/60  Pulse:  (!) 59  Resp: 13 13  Temp:  36.8 C    Last Pain:  Vitals:   02/27/17 1820  TempSrc: Oral  PainSc:                  [name]

## 2017-02-27 NOTE — Anesthesia Procedure Notes (Signed)
Procedure Name: Intubation Date/Time: 02/27/2017 7:30 AM Performed by: Ignacia Bayley Pre-anesthesia Checklist: Patient identified, Emergency Drugs available, Suction available and Patient being monitored Patient Re-evaluated:Patient Re-evaluated prior to inductionOxygen Delivery Method: Circle system utilized Preoxygenation: Pre-oxygenation with 100% oxygen Intubation Type: IV induction Ventilation: Mask ventilation without difficulty Laryngoscope Size: Miller and 2 Grade View: Grade I Tube type: Oral Tube size: 7.0 mm Number of attempts: 1 Airway Equipment and Method: Stylet Placement Confirmation: ETT inserted through vocal cords under direct vision,  positive ETCO2 and breath sounds checked- equal and bilateral Secured at: 20 cm Tube secured with: Tape Dental Injury: Teeth and Oropharynx as per pre-operative assessment

## 2017-02-27 NOTE — Anesthesia Preprocedure Evaluation (Addendum)
Anesthesia Evaluation  Patient identified by MRN, date of birth, ID band Patient awake    Reviewed: Allergy & Precautions, NPO status , Patient's Chart, lab work & pertinent test results  Airway Mallampati: II  TM Distance: >3 FB Neck ROM: Full    Dental  (+) Teeth Intact, Dental Advisory Given, Missing   Pulmonary Current Smoker,    Pulmonary exam normal breath sounds clear to auscultation       Cardiovascular negative cardio ROS Normal cardiovascular exam Rhythm:Regular Rate:Normal     Neuro/Psych negative neurological ROS  negative psych ROS   GI/Hepatic negative GI ROS, Neg liver ROS,   Endo/Other  Obesity   Renal/GU negative Renal ROS     Musculoskeletal negative musculoskeletal ROS (+)   Abdominal   Peds  Hematology negative hematology ROS (+)   Anesthesia Other Findings Day of surgery medications reviewed with the patient.  Reproductive/Obstetrics Fibroids                             Anesthesia Physical Anesthesia Plan  ASA: II  Anesthesia Plan: General   Post-op Pain Management:    Induction: Intravenous  Airway Management Planned: Oral ETT  Additional Equipment:   Intra-op Plan:   Post-operative Plan: Extubation in OR  Informed Consent: I have reviewed the patients History and Physical, chart, labs and discussed the procedure including the risks, benefits and alternatives for the proposed anesthesia with the patient or authorized representative who has indicated his/her understanding and acceptance.   Dental advisory given  Plan Discussed with: CRNA  Anesthesia Plan Comments: (Risks/benefits of general anesthesia discussed with patient including risk of damage to teeth, lips, gum, and tongue, nausea/vomiting, allergic reactions to medications, and the possibility of heart attack, stroke and death.  All patient questions answered.  Patient wishes to proceed.)         Anesthesia Quick Evaluation

## 2017-02-27 NOTE — Transfer of Care (Signed)
Immediate Anesthesia Transfer of Care Note  Patient: Jane Cannon  Procedure(s) Performed: Procedure(s) with comments: MYOMECTOMY (N/A) - requests 7:30am OR time  Requests two hours OR time  Needs Pitessin/chromopertubation OVARIAN CYSTECTOMY (Right) CHROMOPERTUBATION (Bilateral)  Patient Location: PACU  Anesthesia Type:General  Level of Consciousness: sedated  Airway & Oxygen Therapy: Patient Spontanous Breathing and Patient connected to nasal cannula oxygen  Post-op Assessment: Report given to RN and Post -op Vital signs reviewed and stable  Post vital signs: stable  Last Vitals:  Vitals:   02/27/17 0605  Pulse: 66  Resp: 20  Temp: 36.8 C    Last Pain:  Vitals:   02/27/17 0605  TempSrc: Oral         Complications: No apparent anesthesia complications

## 2017-02-27 NOTE — Op Note (Signed)
Operative Note  02/27/2017  9:59 AM  PATIENT:  Jane Cannon  31 y.o. female  PRE-OPERATIVE DIAGNOSIS:  fibroids, right ovarian cyst  POST-OPERATIVE DIAGNOSIS: Intramural and subserosal fibroids bilateral clubbed fallopian tubes small corpus luteum cyst on right ovary extensive pelvic adhesions   PROCEDURE:  Procedure(s):   MYOMECTOMY CHROMOPERTUBATION  SURGEON:  Surgeon(s): Terrance Mass, MD Phineas Real Bresha Block, MD  ANESTHESIA:   general  FINDINGS: Patient with several intramural and subserosal fibroids. Pelvic adhesions. Bilateral clubbed fallopian tube. Chromopertubation no dye extruded from either fallopian tube.  DESCRIPTION OF OPERATION: The patient was taken to the operating room where she underwent a general endotracheal anesthesia. A timeout was undertaken and the procedure to be performed was voiced I allowed. Patient received 2 g of Cefotan for infection prophylaxis and also had PSA stockings for DVT prophylaxis. The abdomen and the vagina were prepped and draped in usual sterile fashion. A pediatric Foley catheter was introduced into the uterus attached to methylene blue dye on the syringe for chromopertubation. A Foley catheter was placed in the bladder to monitor urinary output. The drapes were then placed and a Pfannenstiel incision was made 2 cm above the symphysis pubis the incision was carried out for the skin and subcutaneous tissue down to the rectus fascia were by midline neck was made in the fascia was incised in a transverse fashion. The peritoneal cavity was entered cautiously. Maylard muscle incisions were made for additional exposure since the uterus was encased in pelvic adhesion making it difficult to free. The O'Connor-O'Sullivan retractors were then placed and with reticular dissection the pelvic adhesions that were in the fundus of the uterus and right fallopian tube were meticulously lysed. Pitressin was then injected in the large posterior myoma  and incision was made directly right above the fibroid and it was enucleated and passed the operative field. It was noted that both fallopian tubes were completely clubbed and chromopertubation did not allow any dye to be extruded. A very small corpus luteum cyst was noted in the right ovary otherwise both ovaries were normal. Attention was made to 2 fibroids that were in the fundus once again Pitressin was injected and both fibroids were enucleated and then finally attention was placed to the large fibroid anteriorly located adjacent to the bladder flap Pitressin was also injected and the fibroid was enucleated intact. All these incisions were the myomas were enucleated were closed in a layered fashion with 0 Vicryl suture interrupted figure-of-eight's the uterine cavity was never entered. After several layered closures with 0 Vicryl suture the pelvic cavity was copiously irrigated with normal saline solution sponge count needle count were correct Interceed was placed to prevent future adhesions the visceral peritoneum was not reapproximated at the rectus fascia was closed with a running stitch of 0 Vicryl suture.Exparel was injected suprafascial and subcutaneous for postoperative analgesia for a total of 40 cc. The subcutaneous tennis tissue was reapproximated with a running stitch of 3-0 Vicryl suture and the skin edges were reapproximated with 4-0 Vicryl suture on a Keith needle followed by plate placement of Steri-Strips and pressure dressing. The uterine catheter was removed the Foley catheter was left in place it was somewhat hematinics probably from the retractor. Sponge count needle count were correct patient was extubated and transferred to the recovery room stable vital signs.  The following were the delusions: Vasopressin 20 units in 50 cc of saline 40 cc used  Exparel 20 cc was mixed with 30 cc of 0.25%  Marcaine and 50 cc of saline for total used 30 cc   ESTIMATED BLOOD LOSS: 300  cc   Intake/Output Summary (Last 24 hours) at 02/27/17 0959 Last data filed at 02/27/17 0946  Gross per 24 hour  Intake             2000 ml  Output              750 ml  Net             1250 ml     BLOOD ADMINISTERED:none   LOCAL MEDICATIONS USED:  OTHER: EXPAREL 20 cc fascial and subcutaneous. Vasopressin 40 cc intrauterine  SPECIMEN:  Source of Specimen:  Uterine fibroids  DISPOSITION OF SPECIMEN:  PATHOLOGY  COUNTS:  YES  PLAN OF CARE: Transfer to PACU  [facility] HMD9:59 AMTD@

## 2017-02-28 ENCOUNTER — Encounter (HOSPITAL_COMMUNITY): Payer: Self-pay | Admitting: Gynecology

## 2017-02-28 ENCOUNTER — Encounter: Payer: Self-pay | Admitting: Gynecology

## 2017-02-28 DIAGNOSIS — D251 Intramural leiomyoma of uterus: Secondary | ICD-10-CM | POA: Diagnosis not present

## 2017-02-28 LAB — CBC
HCT: 31.1 % — ABNORMAL LOW (ref 36.0–46.0)
Hemoglobin: 10.6 g/dL — ABNORMAL LOW (ref 12.0–15.0)
MCH: 29.9 pg (ref 26.0–34.0)
MCHC: 34.1 g/dL (ref 30.0–36.0)
MCV: 87.9 fL (ref 78.0–100.0)
PLATELETS: 203 10*3/uL (ref 150–400)
RBC: 3.54 MIL/uL — ABNORMAL LOW (ref 3.87–5.11)
RDW: 14 % (ref 11.5–15.5)
WBC: 14.7 10*3/uL — AB (ref 4.0–10.5)

## 2017-02-28 NOTE — Addendum Note (Signed)
Addendum  created 02/28/17 0915 by Jonna Munro, CRNA   Sign clinical note

## 2017-02-28 NOTE — Progress Notes (Signed)
Teaching complete  Mom with pt  prescripitions given by MD

## 2017-02-28 NOTE — Progress Notes (Signed)
Wasted 12 mL of PCA morphine in sink. Witnessed by Kennith Center, RN.

## 2017-02-28 NOTE — Anesthesia Postprocedure Evaluation (Signed)
Anesthesia Post Note  Patient: Jane Cannon  Procedure(s) Performed: Procedure(s) (LRB): MYOMECTOMY (N/A) OVARIAN CYSTECTOMY (Right) CHROMOPERTUBATION (Bilateral)  Patient location during evaluation: [facility] Anesthesia Type: General Level of consciousness: awake and alert and oriented Pain management: pain level controlled Vital Signs Assessment: post-procedure vital signs reviewed and stable Respiratory status: spontaneous breathing, nonlabored ventilation and respiratory function stable Cardiovascular status: blood pressure returned to baseline Postop Assessment: adequate PO intake and no signs of nausea or vomiting Anesthetic complications: no        Last Vitals:  Vitals:   02/28/17 0648 02/28/17 0800  BP:  (!) 97/53  Pulse:  67  Resp: 16 16  Temp:  36.8 C    Last Pain:  Vitals:   02/28/17 0800  TempSrc: Oral  PainSc:    Pain Goal: Patients Stated Pain Goal: 3 (02/28/17 0500)               Willa Rough

## 2017-02-28 NOTE — Discharge Summary (Signed)
Physician Discharge Summary  Patient ID: Jane Cannon MRN: [id number] DOB/AGE: 14-Feb-1986 31 y.o.  Admit date: 02/27/2017 Discharge date: 02/28/2017  Admission Diagnoses:Symptomatic leiomyomatous uteri  Discharge Diagnoses:  Active Problems:   Postoperative state   Discharged Condition: good  Hospital Course: Patient was admitted to the hospital 02/27/2017 where she underwent an abdominal myomectomy and chromopertubation. Patient did well postoperative incisional bandage was slightly moist and was change a couple times and the rest of the day today has been completely dry. Patient's been ambulating passing gas tolerating regular diet and voiding without any problems. Her vital signs remains stable and afebrile.  Consults: None  Significant Diagnostic Studies: labs: Preoperative hemoglobin 13.9 postoperative hemoglobin 10.6  Treatments: surgery: Abdominal myomectomy with chromopertubation  Discharge Exam: Blood pressure 103/62, pulse 91, temperature 99.2 F (37.3 C), resp. rate 16, height 5\' 2"  (1.575 m), weight 199 lb (90.3 kg), SpO2 100 %. General appearance: alert and cooperative Resp: clear to auscultation bilaterally Cardio: regular rate and rhythm, S1, S2 normal, no murmur, click, rub or gallop GI: soft, non-tender; bowel sounds normal; no masses,  no organomegaly Extremities: extremities normal, atraumatic, no cyanosis or edema Incision/Wound: Dressing completely dry and vaginal pad dry  Disposition: 01-Home or Self Care  Discharge Instructions    Call MD for:  difficulty breathing, headache or visual disturbances    Complete by:  As directed    Call MD for:  hives    Complete by:  As directed    Call MD for:  persistant dizziness or light-headedness    Complete by:  As directed    Call MD for:  persistant nausea and vomiting    Complete by:  As directed    Call MD for:  redness, tenderness, or signs of infection (pain, swelling, redness, odor or green/yellow  discharge around incision site)    Complete by:  As directed    Call MD for:  severe uncontrolled pain    Complete by:  As directed    Call MD for:  temperature >100.4    Complete by:  As directed    Diet general    Complete by:  As directed    Discharge instructions    Complete by:  As directed    Please call the office tomorrow to schedule 2 weeks postop appointment. Removed a bandage tomorrow 6 PM and a little strips next Monday in the shower.Hysterectomy, Abdominal & Vaginal Care After Refer to this sheet in the next few weeks. These discharge instructions provide you with general information on caring for yourself after you leave the hospital. Your caregiver may also give you specific instructions. Your treatment has been planned according to the most current medical practices available, but unavoidable complications sometimes occur. If you have any problems or questions after discharge, please call your caregiver. HOME CARE INSTRUCTIONS Healing will take time. You will have tenderness at the surgery site. There may be some swelling and bruising around this area if you had an abdominal hysterectomy. Have an adult stay with you the first 48 to 72 hours after surgery, and then for 1 to 2 weeks afterward to help with daily activities. Only take over-the-counter or prescription medicines for pain, discomfort, or fever as directed by your caregiver.  Do not take aspirin. It can cause bleeding.  Do not drive when taking pain medicine.  It will be normal to be sore for a couple weeks after surgery. See your caregiver if this seems to be getting worse rather than  better.  Follow your caregiver's advice regarding diet, exercise, lifting, driving, and general activities.  Take showers instead of baths for a few weeks as directed.  You may resume your usual diet as directed.  Get plenty of rest and sleep.  Do not douche, use tampons, or have sexual intercourse until your caregiver says it is okay.   Change your bandages (dressings) as directed if you had an abdominal hysterectomy.  Take your temperature twice a day and write it down.  Do not drink alcohol until your caregiver says it is okay.  If you develop constipation, you may take a mild laxative with your caregiver's permission. Eating bran foods helps with constipation problems. Drink enough water and fluids to keep your urine clear or pale yellow.  Do not sign any legal documents until you feel normal again.  Keep all of your follow-up appointments.  Make sure you and your family understand everything about your operation and recovery.  SEEK MEDICAL CARE IF: There is swelling, redness, or increasing pain in the wound area.  Fluid (pus) is coming from the wound.  You notice a bad smell coming from the wound or surgical dressing.  You have pain, redness, and swelling from the intravenous (IV) site.  The wound breaks open.  You feel dizzy.  You develop pain or bleeding when you urinate.  You develop diarrhea.  You feel sick to your stomach (nauseous) and throw up (vomit).  You develop abnormal vaginal discharge.  You develop a rash.  You have any type of abnormal reaction or develop an allergy to your medicine.  Your pain medicine is not relieving your pain.  seek immediate medical care if: You have an oral temperature above 100, not controlled by medicine. HYYou develop chest pain.  You develop shortness of breath.  You pass out (faint).  You develop pain, swelling, or redness of your leg.  You develop heavy vaginal bleeding, with or without blood clots.  MAKE SURE YOU: Understand these instructions.  Will watch your condition.  Will get help right away if you are not doing well or get worse.  Document Released: 12/23/2003 Document Re-Released: 03/22/2010 [facility] Patient Information 2011 [place].   [facility] HMD5:34 PMTD@   Driving Restrictions    Complete by:  As directed    1 week   Increase activity  slowly    Complete by:  As directed    Lifting restrictions    Complete by:  As directed    6 weeks   Sexual Activity Restrictions    Complete by:  As directed    6 weeks     Allergies as of 02/28/2017      Reactions   Sulfa Antibiotics Hives, Itching      Medication List    STOP taking these medications   ketorolac 10 MG tablet Commonly known as:  TORADOL     TAKE these medications   metoCLOPramide 10 MG tablet Commonly known as:  REGLAN Take 1 tablet (10 mg total) by mouth 3 (three) times daily with meals.   oxyCODONE-acetaminophen 5-325 MG tablet Commonly known as:  PERCOCET Take 1 tablet by mouth every 4 (four) hours as needed for severe pain.        SignedTerrance Mass 02/28/2017, 5:35 PM

## 2017-02-28 NOTE — Progress Notes (Signed)
1 Day Post-Op Procedure(s) (LRB): MYOMECTOMY (N/A)  CHROMOPERTUBATION (Bilateral)  Subjective: Patient reports tolerating PO and no problems voiding.    Objective: I have reviewed patient's vital signs, intake and output, medications and labs.  Preop hemoglobin 13.9 postop hemoglobin 10.6  General: alert and cooperative Resp: clear to auscultation bilaterally Cardio: regular rate and rhythm, S1, S2 normal, no murmur, click, rub or gallop GI: soft, non-tender; bowel sounds normal; no masses,  no organomegaly Extremities: extremities normal, atraumatic, no cyanosis or edema Plan was replaced because of blood in it no active bleeding noted dressing placed  Assessment: s/p Procedure(s) with comments: MYOMECTOMY (N/A) - requests 7:30am OR time  Requests two hours OR time  Needs Pitessin/chromopertubation OVARIAN CYSTECTOMY (Right) CHROMOPERTUBATION (Bilateral): stable, progressing well and tolerating diet  Plan: Foley was discontinued this morning after overnight good urinary output and urine was clear. Incision dressing was was some blood cell and was changed to a clean dressing. Later today patient will take a shower will readdress and continue to monitor incision. Her Foley catheter was discontinued 0600 hrs. this morning. Patient's been tolerating clear liquids will advance to regular diet and reassess this afternoon for possible discharge. Her IV fluids will be discontinued and left with a Hep-Lock IV.  LOS: 1 day    Ronnell Makarewicz H 02/28/2017, 8:44 AM

## 2017-03-05 ENCOUNTER — Other Ambulatory Visit: Payer: Self-pay | Admitting: Gynecology

## 2017-03-05 ENCOUNTER — Telehealth: Payer: Self-pay | Admitting: *Deleted

## 2017-03-05 ENCOUNTER — Ambulatory Visit (INDEPENDENT_AMBULATORY_CARE_PROVIDER_SITE_OTHER): Payer: BLUE CROSS/BLUE SHIELD | Admitting: Gynecology

## 2017-03-05 ENCOUNTER — Encounter: Payer: Self-pay | Admitting: Gynecology

## 2017-03-05 VITALS — BP 118/78 | Temp 98.5°F

## 2017-03-05 DIAGNOSIS — Z09 Encounter for follow-up examination after completed treatment for conditions other than malignant neoplasm: Secondary | ICD-10-CM

## 2017-03-05 DIAGNOSIS — T50905A Adverse effect of unspecified drugs, medicaments and biological substances, initial encounter: Secondary | ICD-10-CM

## 2017-03-05 DIAGNOSIS — T887XXA Unspecified adverse effect of drug or medicament, initial encounter: Secondary | ICD-10-CM

## 2017-03-05 LAB — URINALYSIS W MICROSCOPIC + REFLEX CULTURE
Bilirubin Urine: NEGATIVE
CRYSTALS: NONE SEEN [HPF]
Casts: NONE SEEN [LPF]
GLUCOSE, UA: NEGATIVE
LEUKOCYTES UA: NEGATIVE
Nitrite: NEGATIVE
Specific Gravity, Urine: 1.025 (ref 1.001–1.035)
Yeast: NONE SEEN [HPF]
pH: 6 (ref 5.0–8.0)

## 2017-03-05 MED ORDER — TRAMADOL HCL 50 MG PO TABS: 50.0000 mg | ORAL_TABLET | Freq: Four times a day (QID) | ORAL | 0 refills | Status: DC | PRN

## 2017-03-05 MED ORDER — DOXYCYCLINE HYCLATE 100 MG PO CAPS: 100.0000 mg | ORAL_CAPSULE | Freq: Two times a day (BID) | ORAL | 0 refills | Status: DC

## 2017-03-05 MED ORDER — PREDNISONE 10 MG (21) PO TBPK: ORAL_TABLET | Freq: Every day | ORAL | 0 refills | Status: DC

## 2017-03-05 NOTE — Telephone Encounter (Signed)
Pt called ultram 50 mg was never called in to pharmacy from [facility] today. Rx called in.

## 2017-03-05 NOTE — Progress Notes (Signed)
   Patient is a 31 year old who on May 15 (1 week ago) had undergone an abdominal myomectomy and stayed overnight at the hospital before being discharged. She stated that shortly after getting home she began feeling like she was having a rash and felt clammy but had no temperature at home. She started having vaginal bleeding which would be close the time of her menstrual cycle and was concerned. Patient reports no difficulty in voiding or with bowel movements.  Exam: Blood pressure 118/78, pulse 82, temperature 98.5 Gen. appearance well-developed well-nourished female with the above-mentioned complaint Abdomen: Incision intact patient, soft incision intact Steri-Strips still present slightly tender over the incision no erythematous areas or any drainage Pelvic: Bartholin urethra Skene was within normal limits Some menstrual blood was noted. Bimanual exam slightly tender over the incision but no enlarged masses Adnexa: Same as above Rectal exam: Not done  Urinalysis pending although a culture will be sent there may be blood in her urine because of her menses.  Assessment/plan: Patient possibly with anaphylactic reaction as a result of either this Cefotan that she receive preoperatively in the hospital or from the Leon or Reglan which she was taking. I've told her to discontinue the Percocet and Reglan. I'm going to call her and Ultram 50 mg which she can take one every 8 hours when necessary. She will be placed on a six-day prednisone pack of 10 mg to take as directed and in the event of an endometritis as a result of the myomectomy she will replaced on Vibramycin 100 mg twice a day for 7 days.

## 2017-03-07 ENCOUNTER — Other Ambulatory Visit: Payer: Self-pay | Admitting: Gynecology

## 2017-03-07 LAB — URINE CULTURE

## 2017-03-07 MED ORDER — NITROFURANTOIN MONOHYD MACRO 100 MG PO CAPS: 100.0000 mg | ORAL_CAPSULE | Freq: Two times a day (BID) | ORAL | 0 refills | Status: DC

## 2017-03-14 ENCOUNTER — Encounter: Payer: Self-pay | Admitting: Gynecology

## 2017-03-14 ENCOUNTER — Ambulatory Visit (INDEPENDENT_AMBULATORY_CARE_PROVIDER_SITE_OTHER): Payer: BLUE CROSS/BLUE SHIELD | Admitting: Gynecology

## 2017-03-14 VITALS — BP 122/68 | Wt 188.8 lb

## 2017-03-14 DIAGNOSIS — D500 Iron deficiency anemia secondary to blood loss (chronic): Secondary | ICD-10-CM

## 2017-03-14 DIAGNOSIS — Z09 Encounter for follow-up examination after completed treatment for conditions other than malignant neoplasm: Secondary | ICD-10-CM

## 2017-03-14 LAB — CBC WITH DIFFERENTIAL/PLATELET
BASOS PCT: 0 %
Basophils Absolute: 0 cells/uL (ref 0–200)
Eosinophils Absolute: 83 cells/uL (ref 15–500)
Eosinophils Relative: 1 %
HCT: 36.1 % (ref 35.0–45.0)
HEMOGLOBIN: 11.7 g/dL (ref 11.7–15.5)
Lymphocytes Relative: 30 %
Lymphs Abs: 2490 cells/uL (ref 850–3900)
MCH: 28.5 pg (ref 27.0–33.0)
MCHC: 32.4 g/dL (ref 32.0–36.0)
MCV: 88 fL (ref 80.0–100.0)
MPV: 9.1 fL (ref 7.5–12.5)
Monocytes Absolute: 498 cells/uL (ref 200–950)
Monocytes Relative: 6 %
NEUTROS PCT: 63 %
Neutro Abs: 5229 cells/uL (ref 1500–7800)
Platelets: 362 10*3/uL (ref 140–400)
RBC: 4.1 MIL/uL (ref 3.80–5.10)
RDW: 14.2 % (ref 11.0–15.0)
WBC: 8.3 10*3/uL (ref 3.8–10.8)

## 2017-03-14 NOTE — Patient Instructions (Signed)
In Vitro Fertilization In vitro fertilization (IVF) is a series of procedures that are used to help with getting pregnant (conceiving). It can be used to help treat problems with fertility or genetics. IVF is a type of assisted reproductive technology (ART). ART refers to all treatments and procedures that combine eggs and sperm outside of the body to try to help a couple conceive. During IVF, eggs are retrieved from the ovaries and combined with sperm in a lab to fertilize the eggs. One or more of the fertilized eggs (embryos) are inserted into the uterus through the cervix. Candidates for IVF include:  People who are infertile. Infertility is when you are unable to conceive after a year of having sex regularly without using birth control. Infertility can also mean that a woman is not able to carry a pregnancy to full term.  Women who have undergone early (premature) menopause or ovarian failure.  Women who had both ovaries removed. In this case, donor eggs must be used.  Women who have damaged or blocked fallopian tubes. There is no age limit for having IVF, but it is not recommended for women who have gone through menopause (postmenopausal women). The ideal age for IVF is age 63 or younger. Women age 41 or older are often counseled to consider using donor eggs during IVF to increase the chances of success. Tell a health care provider about:  Any allergies you have.  All medicines you are taking, including vitamins, herbs, eye drops, creams, and over-the-counter medicines.  Any problems you or family members have had with anesthetic medicines.  Any blood disorders you have.  Any surgeries you have had.  Any medical conditions you have.  Previous pregnancies you have had.  Any history of drug use, smoking, or excessive alcohol use. What are the risks? Generally, this is a safe procedure. However, problems may occur, including:  Infection.  Bleeding.  Allergic reactions to  medicines.  Damage to other structures or organs.  Blood clots.  The procedure not working.  Having twins or multiples.  Increased risk of early delivery. What happens before the procedure? Before beginning a cycle of IVF, you and the sperm donor may need various tests (screenings) to make sure that IVF is right for you. You and the donor:  Must provide a complete medical history and the medical history of your families.  Will have a physical exam.  May need blood tests to check for infectious diseases, including HIV (human immunodeficiency virus). You may have other tests, such as:  Testing of your ovaries to determine the quality and quantity of your eggs.  Hormone tests and ovulation testing.  An exam of your uterus. This may be done with:  Sonohysterogram. This exam uses sound waves sent to a computer to make real-time images of the inside of your uterus. To get the best images, a germ-free salt-water solution (sterile saline) is injected into your uterus through your vagina.  Hysteroscopy. In this procedure, a thin, flexible tube with a tiny light and camera on the end of it (hysteroscope) is inserted through your vagina and into your uterus. The donor sperm will be taken and analyzed to check whether:  The sperm are normal.  There are enough sperm to fertilize the egg.  The sperm act normally after sexual intercourse (postcoital exam). What happens during the procedure? IVF involves several procedures. One cycle of IVF can take about 2 weeks, and more than one cycle may be required. The steps of IVF are:  Ovarian stimulation.  If you use your own eggs during IVF, you will begin treatment with artificial (synthetic) hormones at the start of a cycle. This treatment stimulates your ovaries to produce multiple eggs, rather than the single egg that normally develops each month.  Multiple eggs are needed because some eggs will not fertilize or will not develop normally  after fertilization.  Egg retrieval.  Using ultrasound images as a guide, a thin needle will be inserted through your vagina and into the ovary and sacs (follicles) that contain the eggs.  The needle is connected to a suction device, which will pull the eggs and fluid out of each follicle, one at a time.  The procedure will be repeated for the other ovary.  Insemination and fertilization.  The sperm will be mixed together with your eggs (insemination) and stored in an environmentally controlled chamber.  The sperm usually enters (fertilizes) an egg a few hours after insemination.  Embryo transfer. This usually takes place 2-6 days after the egg retrieval.  The fertilized eggs (embryos) will be placed into your uterus using a thin tube (catheter). The catheter will be inserted into your vagina, through your cervix, and into your uterus.  If successful, the embryo will stick to the lining of your uterus (implant) about 6-10 days after egg retrieval. If an embryo implants in the lining of the uterus and grows, pregnancy will result. These procedures may vary among health care providers and hospitals. What happens after the procedure?  You may need to lie down and rest for a short time.  You may continue to take hormone therapy. You may take hormone therapy for up to 3 months, as instructed by your health care provider. Summary  In vitro fertilization (IVF) is a series of procedures that are used to help with getting pregnant (conceiving).  Before beginning a cycle of IVF, you and the sperm donor may need various tests (screenings) to make sure that IVF is right for you.  IVF involves several procedures. One cycle of IVF can take about 2 weeks, and more than one cycle may be required. This information is not intended to replace advice given to you by your health care provider. Make sure you discuss any questions you have with your health care provider. Document Released: 09/14/2008  Document Revised: 07/03/2016 Document Reviewed: 07/03/2016 Elsevier Interactive Patient Education  2017 [employer].

## 2017-03-14 NOTE — Progress Notes (Signed)
Patient ID: Jane Cannon, female   DOB: October 02, 1986, 31 y.o.   MRN: [id number]    Patient is a 31 year old that presented to the office her two-week postop appointment. Patient status post abdominal myomectomy and chromopertubation. Patient with intramural and subserosal fibroids symptomatic causing heavy periods and a question we'll right ovarian cyst. Patient doing well from her surgery findings as well as pictures were shared with the patient.  Findings: Patient with several intramural subserosal fibroids. Pelvic adhesions. Bilateral clubbed fallopian tube. Chromopertubation demonstrated no dye extruding from either fallopian tube. No ovarian cysts seen  Pathology report: Diagnosis Fibroid - LEIOMYOMATA (LARGEST 7.3 CM)  Exam: Pfannenstiel incision completely healed a small suture material was found in the right lateral H which was removed. The area was to pre-with hydrogen peroxide followed by placement Neosporin and a Band-Aid.  Pelvic: Bimanual exam no palpable masses or tenderness rectal exam not done  Assessment/plan: Patient 2 weeks status post abdominal myomectomy. Previously reoperative ovarian cyst seen resolved possibly corpus luteum cyst. No cystectomy done. Patient to return back in 4 weeks for final postop visit. The findings of her clubbed bilateral fallopian tube were discussed that in the near future when she attempts to get pregnant she would best be served by in vitro fertilization for which literature information was provided.

## 2017-03-15 ENCOUNTER — Encounter: Payer: Self-pay | Admitting: *Deleted

## 2017-03-15 ENCOUNTER — Telehealth: Payer: Self-pay | Admitting: *Deleted

## 2017-03-15 MED ORDER — VARENICLINE TARTRATE 0.5 MG PO TABS: ORAL_TABLET | ORAL | 2 refills | Status: DC

## 2017-03-15 NOTE — Telephone Encounter (Signed)
Please call in prescription for Chantix:  Days 1-3:0.5 mg by mouth daily Days 4 -7:0.5 mg by mouth twice a day Day 8 to and of treatment 1 mg by mouth twice a day  Set a target quit date of 12 weeks may continue another 12 weeks of 1 mg every 12 hours if needed  #30 with 2 refill

## 2017-03-15 NOTE — Telephone Encounter (Signed)
Pt called stating she thought a Rx for Chantix was going to be sent to pharmacy from post op visit yesterday? Please advise

## 2017-03-15 NOTE — Telephone Encounter (Signed)
Pt informed, Rx sent. 

## 2017-04-10 ENCOUNTER — Telehealth: Payer: Self-pay

## 2017-04-10 ENCOUNTER — Ambulatory Visit (INDEPENDENT_AMBULATORY_CARE_PROVIDER_SITE_OTHER): Payer: BLUE CROSS/BLUE SHIELD | Admitting: Gynecology

## 2017-04-10 ENCOUNTER — Encounter: Payer: Self-pay | Admitting: Gynecology

## 2017-04-10 VITALS — BP 122/80

## 2017-04-10 DIAGNOSIS — F17200 Nicotine dependence, unspecified, uncomplicated: Secondary | ICD-10-CM

## 2017-04-10 DIAGNOSIS — N898 Other specified noninflammatory disorders of vagina: Secondary | ICD-10-CM

## 2017-04-10 DIAGNOSIS — A599 Trichomoniasis, unspecified: Secondary | ICD-10-CM

## 2017-04-10 DIAGNOSIS — Z113 Encounter for screening for infections with a predominantly sexual mode of transmission: Secondary | ICD-10-CM | POA: Diagnosis not present

## 2017-04-10 DIAGNOSIS — Z09 Encounter for follow-up examination after completed treatment for conditions other than malignant neoplasm: Secondary | ICD-10-CM

## 2017-04-10 LAB — WET PREP FOR TRICH, YEAST, CLUE: YEAST WET PREP: NONE SEEN

## 2017-04-10 MED ORDER — METRONIDAZOLE 500 MG PO TABS: 500.0000 mg | ORAL_TABLET | Freq: Two times a day (BID) | ORAL | 0 refills | Status: DC

## 2017-04-10 MED ORDER — VARENICLINE TARTRATE 0.5 MG PO TABS: ORAL_TABLET | ORAL | 2 refills | Status: DC

## 2017-04-10 MED ORDER — VARENICLINE TARTRATE 1 MG PO TABS: 1.0000 mg | ORAL_TABLET | Freq: Two times a day (BID) | ORAL | 3 refills | Status: DC

## 2017-04-10 NOTE — Patient Instructions (Signed)
Varenicline oral tablets What is this medicine? VARENICLINE (var EN i kleen) is used to help people quit smoking. It can reduce the symptoms caused by stopping smoking. It is used with a patient support program recommended by your physician. This medicine may be used for other purposes; ask your health care provider or pharmacist if you have questions. COMMON BRAND NAME(S): Chantix What should I tell my health care provider before I take this medicine? They need to know if you have any of these conditions: -bipolar disorder, depression, schizophrenia or other mental illness -heart disease -if you often drink alcohol -kidney disease -peripheral vascular disease -seizures -stroke -suicidal thoughts, plans, or attempt; a previous suicide attempt by you or a family member -an unusual or allergic reaction to varenicline, other medicines, foods, dyes, or preservatives -pregnant or trying to get pregnant -breast-feeding How should I use this medicine? Take this medicine by mouth after eating. Take with a full glass of water. Follow the directions on the prescription label. Take your doses at regular intervals. Do not take your medicine more often than directed. There are 3 ways you can use this medicine to help you quit smoking; talk to your health care professional to decide which plan is right for you: 1) you can choose a quit date and start this medicine 1 week before the quit date, or, 2) you can start taking this medicine before you choose a quit date, and then pick a quit date between day 8 and 35 days of treatment, or, 3) if you are not sure that you are able or willing to quit smoking right away, start taking this medicine and slowly decrease the amount you smoke as directed by your health care professional with the goal of being cigarette-free by week 12 of treatment. Stick to your plan; ask about support groups or other ways to help you remain cigarette-free. If you are motivated to quit  smoking and did not succeed during a previous attempt with this medicine for reasons other than side effects, or if you returned to smoking after this treatment, speak with your health care professional about whether another course of this medicine may be right for you. A special MedGuide will be given to you by the pharmacist with each prescription and refill. Be sure to read this information carefully each time. Talk to your pediatrician regarding the use of this medicine in children. This medicine is not approved for use in children. Overdosage: If you think you have taken too much of this medicine contact a poison control center or emergency room at once. NOTE: This medicine is only for you. Do not share this medicine with others. What if I miss a dose? If you miss a dose, take it as soon as you can. If it is almost time for your next dose, take only that dose. Do not take double or extra doses. What may interact with this medicine? -alcohol or any product that contains alcohol -insulin -other stop smoking aids -theophylline -warfarin This list may not describe all possible interactions. Give your health care provider a list of all the medicines, herbs, non-prescription drugs, or dietary supplements you use. Also tell them if you smoke, drink alcohol, or use illegal drugs. Some items may interact with your medicine. What should I watch for while using this medicine? Visit your doctor or health care professional for regular check ups. Ask for ongoing advice and encouragement from your doctor or healthcare professional, friends, and family to help you quit. If   you smoke while on this medication, quit again Your mouth may get dry. Chewing sugarless gum or sucking hard candy, and drinking plenty of water may help. Contact your doctor if the problem does not go away or is severe. You may get drowsy or dizzy. Do not drive, use machinery, or do anything that needs mental alertness until you know how  this medicine affects you. Do not stand or sit up quickly, especially if you are an older patient. This reduces the risk of dizzy or fainting spells. Sleepwalking can happen during treatment with this medicine, and can sometimes lead to behavior that is harmful to you, other people, or property. Stop taking this medicine and tell your doctor if you start sleepwalking or have other unusual sleep-related activity. Decrease the amount of alcoholic beverages that you drink during treatment with this medicine until you know if this medicine affects your ability to tolerate alcohol. Some people have experienced increased drunkenness (intoxication), unusual or sometimes aggressive behavior, or no memory of things that have happened (amnesia) during treatment with this medicine. The use of this medicine may increase the chance of suicidal thoughts or actions. Pay special attention to how you are responding while on this medicine. Any worsening of mood, or thoughts of suicide or dying should be reported to your health care professional right away. What side effects may I notice from receiving this medicine? Side effects that you should report to your doctor or health care professional as soon as possible: -allergic reactions like skin rash, itching or hives, swelling of the face, lips, tongue, or throat -acting aggressive, being angry or violent, or acting on dangerous impulses -breathing problems -changes in vision -chest pain or chest tightness -confusion, trouble speaking or understanding -new or worsening depression, anxiety, or panic attacks -extreme increase in activity and talking (mania) -fast, irregular heartbeat -feeling faint or lightheaded, falls -fever -pain in legs when walking -problems with balance, talking, walking -redness, blistering, peeling or loosening of the skin, including inside the mouth -ringing in ears -seeing or hearing things that aren't there  (hallucinations) -seizures -sleepwalking -sudden numbness or weakness of the face, arm or leg -thoughts about suicide or dying, or attempts to commit suicide -trouble passing urine or change in the amount of urine -unusual bleeding or bruising -unusually weak or tired Side effects that usually do not require medical attention (report to your doctor or health care professional if they continue or are bothersome): -constipation -headache -nausea, vomiting -strange dreams -stomach gas -trouble sleeping This list may not describe all possible side effects. Call your doctor for medical advice about side effects. You may report side effects to FDA at 1-800-FDA-1088. Where should I keep my medicine? Keep out of the reach of children. Store at room temperature between 15 and 30 degrees C (59 and 86 degrees F). Throw away any unused medicine after the expiration date. NOTE: This sheet is a summary. It may not cover all possible information. If you have questions about this medicine, talk to your doctor, pharmacist, or health care provider.  2018 Elsevier/Gold Standard (2015-06-17 16:14:23) Metronidazole tablets or capsules What is this medicine? METRONIDAZOLE (me troe NI da zole) is an antiinfective. It is used to treat certain kinds of bacterial and protozoal infections. It will not work for colds, flu, or other viral infections. This medicine may be used for other purposes; ask your health care provider or pharmacist if you have questions. COMMON BRAND NAME(S): Flagyl What should I tell my health care provider before  I take this medicine? They need to know if you have any of these conditions: -anemia or other blood disorders -disease of the nervous system -fungal or yeast infection -if you drink alcohol containing drinks -liver disease -seizures -an unusual or allergic reaction to metronidazole, or other medicines, foods, dyes, or preservatives -pregnant or trying to get  pregnant -breast-feeding How should I use this medicine? Take this medicine by mouth with a full glass of water. Follow the directions on the prescription label. Take your medicine at regular intervals. Do not take your medicine more often than directed. Take all of your medicine as directed even if you think you are better. Do not skip doses or stop your medicine early. Talk to your pediatrician regarding the use of this medicine in children. Special care may be needed. Overdosage: If you think you have taken too much of this medicine contact a poison control center or emergency room at once. NOTE: This medicine is only for you. Do not share this medicine with others. What if I miss a dose? If you miss a dose, take it as soon as you can. If it is almost time for your next dose, take only that dose. Do not take double or extra doses. What may interact with this medicine? Do not take this medicine with any of the following medications: -alcohol or any product that contains alcohol -amprenavir oral solution -cisapride -disulfiram -dofetilide -dronedarone -paclitaxel injection -pimozide -ritonavir oral solution -sertraline oral solution -sulfamethoxazole-trimethoprim injection -thioridazine -ziprasidone This medicine may also interact with the following medications: -birth control pills -cimetidine -lithium -other medicines that prolong the QT interval (cause an abnormal heart rhythm) -phenobarbital -phenytoin -warfarin This list may not describe all possible interactions. Give your health care provider a list of all the medicines, herbs, non-prescription drugs, or dietary supplements you use. Also tell them if you smoke, drink alcohol, or use illegal drugs. Some items may interact with your medicine. What should I watch for while using this medicine? Tell your doctor or health care professional if your symptoms do not improve or if they get worse. You may get drowsy or dizzy. Do not  drive, use machinery, or do anything that needs mental alertness until you know how this medicine affects you. Do not stand or sit up quickly, especially if you are an older patient. This reduces the risk of dizzy or fainting spells. Avoid alcoholic drinks while you are taking this medicine and for three days afterward. Alcohol may make you feel dizzy, sick, or flushed. If you are being treated for a sexually transmitted disease, avoid sexual contact until you have finished your treatment. Your sexual partner may also need treatment. What side effects may I notice from receiving this medicine? Side effects that you should report to your doctor or health care professional as soon as possible: -allergic reactions like skin rash or hives, swelling of the face, lips, or tongue -confusion, clumsiness -difficulty speaking -discolored or sore mouth -dizziness -fever, infection -numbness, tingling, pain or weakness in the hands or feet -trouble passing urine or change in the amount of urine -redness, blistering, peeling or loosening of the skin, including inside the mouth -seizures -unusually weak or tired -vaginal irritation, dryness, or discharge Side effects that usually do not require medical attention (report to your doctor or health care professional if they continue or are bothersome): -diarrhea -headache -irritability -metallic taste -nausea -stomach pain or cramps -trouble sleeping This list may not describe all possible side effects. Call your doctor for  medical advice about side effects. You may report side effects to FDA at 1-800-FDA-1088. Where should I keep my medicine? Keep out of the reach of children. Store at room temperature below 25 degrees C (77 degrees F). Protect from light. Keep container tightly closed. Throw away any unused medicine after the expiration date. NOTE: This sheet is a summary. It may not cover all possible information. If you have questions about this  medicine, talk to your doctor, pharmacist, or health care provider.  2018 Elsevier/Gold Standard (2013-05-09 14:08:39) Trichomoniasis Trichomoniasis is an STI (sexually transmitted infection) that can affect both women and men. In women, the outer area of the female genitalia (vulva) and the vagina are affected. In men, the penis is mainly affected, but the prostate and other reproductive organs can also be involved. This condition can be treated with medicine. It often has no symptoms (is asymptomatic), especially in men. What are the causes? This condition is caused by an organism called Trichomonas vaginalis. Trichomoniasis most often spreads from person to person (is contagious) through sexual contact. What increases the risk? The following factors may make you more likely to develop this condition:  Having unprotected sexual intercourse.  Having sexual intercourse with a partner who has trichomoniasis.  Having multiple sexual partners.  Having had previous trichomoniasis infections or other STIs.  What are the signs or symptoms? In women, symptoms of trichomoniasis include:  Abnormal vaginal discharge that is clear, white, gray, or yellow-green and foamy and has an unusual "fishy" odor.  Itching and irritation of the vagina and vulva.  Burning or pain during urination or sexual intercourse.  Genital redness and swelling.  In men, symptoms of trichomoniasis include:  Penile discharge that may be foamy or contain pus.  Pain in the penis. This may happen only when urinating.  Itching or irritation inside the penis.  Burning after urination or ejaculation.  How is this diagnosed? In women, this condition may be found during a routine Pap test or physical exam. It may be found in men during a routine physical exam. Your health care provider may perform tests to help diagnose this infection, such as:  Urine tests (men and women).  The following in women: ? Testing the pH  of the vagina. ? A vaginal swab test that checks for the Trichomonas vaginalis organism. ? Testing vaginal secretions.  Your health care provider may test you for other STIs, including HIV (human immunodeficiency virus). How is this treated? This condition is treated with medicine taken by mouth (orally), such as metronidazole or tinidazole to fight the infection. Your sexual partner(s) may also need to be tested and treated.  If you are a woman and you plan to become pregnant or think you may be pregnant, tell your health care provider right away. Some medicines that are used to treat the infection should not be taken during pregnancy.  Your health care provider may recommend over-the-counter medicines or creams to help relieve itching or irritation. You may be tested for infection again 3 months after treatment. Follow these instructions at home:  Take and use over-the-counter and prescription medicines, including creams, only as told by your health care provider.  Do not have sexual intercourse until one week after you finish your medicine, or until your health care provider approves. Ask your health care provider when you may resume sexual intercourse.  (Women) Do not douche or wear tampons while you have the infection.  Discuss your infection with your sexual partner(s). Make sure that  your partner gets tested and treated, if necessary.  Keep all follow-up visits as told by your health care provider. This is important. How is this prevented?  Use condoms every time you have sex. Using condoms correctly and consistently can help protect against STIs.  Avoid having multiple sexual partners.  Talk with your sexual partner about any symptoms that either of you may have, as well as any history of STIs.  Get tested for STIs and STDs (sexually transmitted diseases) before you have sex. Ask your partner to do the same.  Do not have sexual contact if you have symptoms of trichomoniasis or  another STI. Contact a health care provider if:  You still have symptoms after you finish your medicine.  You develop pain in your abdomen.  You have pain when you urinate.  You have bleeding after sexual intercourse.  You develop a rash.  You feel nauseous or you vomit.  You plan to become pregnant or think you may be pregnant. Summary  Trichomoniasis is an STI (sexually transmitted infection) that can affect both women and men.  This condition often has no symptoms (is asymptomatic), especially in men.  You should not have sexual intercourse until one week after you finish your medicine, or until your health care provider approves. Ask your health care provider when you may resume sexual intercourse.  Discuss your infection with your sexual partner. Make sure that your partner gets tested and treated, if necessary. This information is not intended to replace advice given to you by your health care provider. Make sure you discuss any questions you have with your health care provider. Document Released: 03/28/2001 Document Revised: 08/25/2016 Document Reviewed: 08/25/2016 Elsevier Interactive Patient Education  2017 [employer].

## 2017-04-10 NOTE — Progress Notes (Signed)
   Patient is a 31 year old that presented to the office complaining of vaginal discharge with odor. 6 weeks ago patient had an abdominal myomectomy. Patient states her partner has been the same but she had not had intercourse with him for several months prior to her surgery and just recently she had for the first time. Patient has had history in the past of chlamydia.  Patient 6 weeks ago underwent an abdominal myomectomy and chromopertubation for symptomatic leiomyomatous uteri. She also had pelvic adhesions and bilateral clubbed fallopian tube to the chromopertubation did not demonstrate any tubal patency.  Exam: Abdomen: Soft nontender no rebound or guarding Pfannenstiel incision completely healed Pelvic: Bartholin urethra Skene was within normal limits Vagina slight clear discharge erythematous vaginal mucosa Cervix: No gross lesions on inspection Bimanual exam uterus anteverted normal size shape and consistency nontender Adnexa: No palpable masses or tenderness Rectal exam not done  Wet prep Trichomonas was present along with moderate amount of clue cells and white blood cells and too numerous to count bacteria  Assessment/plan: Patient 6 weeks status post total abdominal myomectomy with recent intercourse and complaining of vaginal discharge whereby trichomoniasis was identified. Patient will be prescribed Flagyl 500 mg twice a day for 7 days. A GC and Chlamydia culture was obtained today along with an HIV, RPR, hepatitis B and C blood test. Patient will return back in 2 weeks for test of cure and she is to notify her partner's intake and be treated as well.

## 2017-04-10 NOTE — Telephone Encounter (Signed)
New rx sent to pharmacy

## 2017-04-10 NOTE — Telephone Encounter (Signed)
Pharmacist called stating that you sent Rx for starter pack for Chantix for her but she has already done the starter pack and needs continuing dose pack.  They would like Rx for that instead if okay with you.

## 2017-04-10 NOTE — Telephone Encounter (Signed)
Yes - thank you

## 2017-04-11 ENCOUNTER — Ambulatory Visit: Payer: BLUE CROSS/BLUE SHIELD | Admitting: Gynecology

## 2017-04-11 LAB — GC/CHLAMYDIA PROBE AMP
CT Probe RNA: NOT DETECTED
GC PROBE AMP APTIMA: NOT DETECTED

## 2017-04-11 LAB — HIV ANTIBODY (ROUTINE TESTING W REFLEX): HIV 1&2 Ab, 4th Generation: NONREACTIVE

## 2017-04-11 LAB — HEPATITIS C ANTIBODY: HCV AB: NEGATIVE

## 2017-04-11 LAB — RPR

## 2017-04-11 LAB — HEPATITIS B SURFACE ANTIGEN: Hepatitis B Surface Ag: NEGATIVE

## 2017-04-23 ENCOUNTER — Ambulatory Visit (INDEPENDENT_AMBULATORY_CARE_PROVIDER_SITE_OTHER): Payer: BLUE CROSS/BLUE SHIELD | Admitting: Gynecology

## 2017-04-23 ENCOUNTER — Encounter: Payer: Self-pay | Admitting: Gynecology

## 2017-04-23 VITALS — BP 122/80

## 2017-04-23 DIAGNOSIS — L739 Follicular disorder, unspecified: Secondary | ICD-10-CM | POA: Diagnosis not present

## 2017-04-23 DIAGNOSIS — Z8619 Personal history of other infectious and parasitic diseases: Secondary | ICD-10-CM

## 2017-04-23 LAB — WET PREP FOR TRICH, YEAST, CLUE
Trich, Wet Prep: NONE SEEN
Yeast Wet Prep HPF POC: NONE SEEN

## 2017-04-23 MED ORDER — DOXYCYCLINE HYCLATE 100 MG PO CAPS: 100.0000 mg | ORAL_CAPSULE | Freq: Two times a day (BID) | ORAL | 0 refills | Status: DC

## 2017-04-23 NOTE — Patient Instructions (Signed)
MRSA Infection, Adult MRSA stands for methicillin-resistant Staphylococcus aureus. This type of infection is caused by Staphylococcus aureus bacteria that are no longer affected by the medicines used to kill them (drug resistant). Staphylococcus (staph) bacteria are normally found on the skin or in the nose of healthy people. In most cases, these bacteria do not cause infection. But if these resistant bacteria enter your body through a cut or sore, they can cause a serious infection on your skin or in other parts of your body. There is a slight chance that the staph on your skin or in your nose is MRSA. There are two types of MRSA infections:  Hospital-acquired MRSA is bacteria that you get in the hospital.  Community-acquired MRSA is bacteria that you get somewhere other than in a hospital.  What increases the risk? Hospital-acquired MRSA is more common. You could be at risk for this infection if you are in the hospital and you:  Have surgery or a procedure.  Have an IV access or a catheter tube placed in your body.  Have weak resistance to germs (weakened immune system).  Are elderly.  Are on kidney dialysis.  You could be at risk for community-acquired MRSA if you have a break in your skin and come into contact with MRSA. This may happen if you:  Play sports where there is skin-to-skin contact.  Live in a crowded setting, like a dormitory or a military barracks.  Share towels, razors, or sports equipment with other people.  What are the signs or symptoms? Symptoms of hospital-acquired MRSA depend on where MRSA has spread. Symptoms may include:  Wound infection.  Skin infection.  Rash.  Pneumonia.  Fever and chills.  Difficulty breathing.  Chest pain.  Community-acquired MRSA is most likely to start as a scratch or cut that becomes infected. Symptoms may include:  A pus-filled pimple.  A boil on your skin.  Pus draining from your skin.  A sore (abscess) under  your skin or somewhere in your body.  Fever with or without chills.  How is this diagnosed? The diagnosis of MRSA is made by taking a sample from an infected area and sending it to a lab for testing. A lab technician can grow (culture) MRSA and check it under a microscope. The cultured MRSA can be tested to see which type of antibiotic medicine will work to treat it. Newer tests can identify MRSA more quickly by testing bacteria samples for MRSA genes. Your health care provider can diagnose MRSA using samples from:  Cuts or wounds in infected areas.  Nasal swabs.  Saliva or cough specimens from deep in the lungs (sputum).  Urine.  Blood.  You may also have:  Imaging studies (such as X-ray or MRI) to check if the infection has spread to the lungs, bones, or joints.  A culture and sensitivity test of blood or fluids from inside the joints.  How is this treated? Treatment depends on how severe, deep, or extensive the infection is. Very bad infections may require a hospital stay.  Some skin infections, such as a small boil or sore (abscess), may be treated by draining pus from the site of the infection.  More extensive surgery to drain pus may be necessary for deeper or more widespread soft tissue infections.  You may then have to take antibiotic medicine given by mouth or through a vein. You may start antibiotic treatment right away or after testing can be done to see what antibiotic medicine should be   used.  Follow these instructions at home:  Take your antibiotics as directed by your health care provider. Take the medicine as prescribed until it is finished.  Avoid close contact with those around you as much as possible. Do not use towels, razors, toothbrushes, bedding, or other items that will be used by others.  Wash your hands frequently for 15 seconds with soap and water. Dry your hands with a clean or disposable towel.  When you are not able to wash your hands, use hand  sanitizer that is more than 60 percent alcohol.  Wash towels, sheets, or clothes in the washing machine with detergent and hot water. Dry them in a hot dryer.  Follow your health care provider's instructions for wound care. Wash your hands before and after changing your bandages.  Always shower after exercising.  Keep all cuts and scrapes clean and covered with a bandage.  Be sure to tell all your health care providers that you have MRSA so they are aware of your infection. Contact a health care provider if:  You have a cut, scrape, pimple, or boil that becomes red, swollen, or painful or has pus in it.  You have pus draining from your skin.  You have an abscess under your skin or somewhere in your body. Get help right away if:  You have symptoms of a skin infection with a fever or chills.  You have trouble breathing.  You have chest pain.  You have a skin wound and you become nauseous or start vomiting. This information is not intended to replace advice given to you by your health care provider. Make sure you discuss any questions you have with your health care provider. Document Released: 10/02/2005 Document Revised: 03/09/2016 Document Reviewed: 07/25/2013 Elsevier Interactive Patient Education  2017 [employer] Folliculitis Folliculitis is inflammation of the hair follicles. Folliculitis most commonly occurs on the scalp, thighs, legs, back, and buttocks. However, it can occur anywhere on the body. What are the causes? This condition may be caused by:  A bacterial infection (common).  A fungal infection.  A viral infection.  Coming into contact with certain chemicals, especially oils and tars.  Shaving or waxing.  Applying greasy ointments or creams to your skin often.  Long-lasting folliculitis and folliculitis that keeps coming back can be caused by bacteria that live in the nostrils. What increases the risk? This condition is more likely to develop in people  with:  A weakened immune system.  Diabetes.  Obesity.  What are the signs or symptoms? Symptoms of this condition include:  Redness.  Soreness.  Swelling.  Itching.  Small white or yellow, pus-filled, itchy spots (pustules) that appear over a reddened area. If there is an infection that goes deep into the follicle, these may develop into a boil (furuncle).  A group of closely packed boils (carbuncle). These tend to form in hairy, sweaty areas of the body.  How is this diagnosed? This condition is diagnosed with a skin exam. To find what is causing the condition, your health care provider may take a sample of one of the pustules or boils for testing. How is this treated? This condition may be treated by:  Applying warm compresses to the affected areas.  Taking an antibiotic medicine or applying an antibiotic medicine to the skin.  Applying or bathing with an antiseptic solution.  Taking an over-the-counter medicine to help with itching.  Having a procedure to drain any pustules or boils. This may be done if  a pustule or boil contains a lot of pus or fluid.  Laser hair removal. This may be done to treat long-lasting folliculitis.  Follow these instructions at home:  If directed, apply heat to the affected area as often as told by your health care provider. Use the heat source that your health care provider recommends, such as a moist heat pack or a heating pad. ? Place a towel between your skin and the heat source. ? Leave the heat on for 20-30 minutes. ? Remove the heat if your skin turns bright red. This is especially important if you are unable to feel pain, heat, or cold. You may have a greater risk of getting burned.  If you were prescribed an antibiotic medicine, use it as told by your health care provider. Do not stop using the antibiotic even if you start to feel better.  Take over-the-counter and prescription medicines only as told by your health care  provider.  Do not shave irritated skin.  Keep all follow-up visits as told by your health care provider. This is important. Get help right away if:  You have more redness, swelling, or pain in the affected area.  Red streaks are spreading from the affected area.  You have a fever. This information is not intended to replace advice given to you by your health care provider. Make sure you discuss any questions you have with your health care provider. Document Released: 12/11/2001 Document Revised: 04/21/2016 Document Reviewed: 07/23/2015 Elsevier Interactive Patient Education  2018 [employer].

## 2017-04-23 NOTE — Progress Notes (Signed)
   Patient is a 31 year old that presented to the office today with a complaint of induration and redness and tenderness on the right medial thigh. She is also here for test of cure and the fact that she was seen the office for her 6 weeks postop appointment back in June 2060 was complaining of vaginal discharge whereby trichomoniasis was detected and she was placed on Flagyl 500 mg twice a day for 7 days. She states her partner was treated. She is not having any vaginal discharge. She did have a negative HIV, RPR, hepatitis B and C recently as well.  Exam: Medial right thigh appears to have an infected hair follicle slightly indurated and erythematous Pelvic: Bartholin urethra Skene was within normal limits Vagina: No lesions or discharge Cervix: No lesions or discharge Bimanual exam not done  Wet prep moderate clue cells, few white blood cell, many bacteria  Assessment/plan: Patient with acute folliculitis of right medial thigh to cover for MRSA she'll be prescribed Vibramycin 100 mg twice a day for 2 weeks and she is to apply Neosporin antibacterial cream twice a day as well as antibacterial soap 1 to twice a day to the area. If no improvement of the area increased in size she'll return to the office where she may need incision and drainage. The Vibramycin should help with her wet prep results. Patient otherwise scheduled to return to the office next year for annual exam or when necessary.

## 2017-04-23 NOTE — Addendum Note (Signed)
Addended by: Burnett Kanaris on: 04/23/2017 04:14 PM   Modules accepted: Orders

## 2017-04-24 ENCOUNTER — Ambulatory Visit: Payer: BLUE CROSS/BLUE SHIELD | Admitting: Gynecology

## 2017-05-04 ENCOUNTER — Encounter: Payer: Self-pay | Admitting: Gynecology

## 2022-05-17 DIAGNOSIS — Z3042 Encounter for surveillance of injectable contraceptive: Secondary | ICD-10-CM | POA: Diagnosis not present

## 2024-08-12 ENCOUNTER — Ambulatory Visit: Admitting: Internal Medicine

## 2024-08-12 ENCOUNTER — Encounter: Payer: Self-pay | Admitting: Internal Medicine

## 2024-10-01 ENCOUNTER — Ambulatory Visit

## 2024-10-15 ENCOUNTER — Ambulatory Visit

## 2024-11-03 ENCOUNTER — Ambulatory Visit: Admitting: Internal Medicine
